# Patient Record
Sex: Female | Born: 1950 | Hispanic: No | Marital: Married | Smoking: Never smoker
Health system: Southern US, Community
[De-identification: ages and names within clinical notes are randomized; demographics above are authoritative.]

## PROBLEM LIST (undated history)

## (undated) DIAGNOSIS — B029 Zoster without complications: Secondary | ICD-10-CM

## (undated) DIAGNOSIS — M549 Dorsalgia, unspecified: Secondary | ICD-10-CM

## (undated) DIAGNOSIS — J189 Pneumonia, unspecified organism: Secondary | ICD-10-CM

## (undated) DIAGNOSIS — M8580 Other specified disorders of bone density and structure, unspecified site: Secondary | ICD-10-CM

## (undated) DIAGNOSIS — G43909 Migraine, unspecified, not intractable, without status migrainosus: Secondary | ICD-10-CM

## (undated) DIAGNOSIS — E049 Nontoxic goiter, unspecified: Secondary | ICD-10-CM

## (undated) HISTORY — DX: Nontoxic goiter, unspecified: E04.9

## (undated) HISTORY — DX: Pneumonia, unspecified organism: J18.9

## (undated) HISTORY — DX: Migraine, unspecified, not intractable, without status migrainosus: G43.909

## (undated) HISTORY — DX: Zoster without complications: B02.9

## (undated) HISTORY — DX: Other specified disorders of bone density and structure, unspecified site: M85.80

## (undated) HISTORY — DX: Dorsalgia, unspecified: M54.9

---
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
## 1991-07-25 HISTORY — PX: TUBAL LIGATION: SHX77

## 1991-07-25 HISTORY — PX: DILATION AND CURETTAGE OF UTERUS: SHX78

## 2000-07-24 HISTORY — PX: COLONOSCOPY W/ POLYPECTOMY: SHX1380

## 2010-07-24 DIAGNOSIS — E049 Nontoxic goiter, unspecified: Secondary | ICD-10-CM

## 2010-07-24 HISTORY — DX: Nontoxic goiter, unspecified: E04.9

## 2010-12-14 ENCOUNTER — Other Ambulatory Visit: Payer: Self-pay | Admitting: Internal Medicine

## 2010-12-14 DIAGNOSIS — E041 Nontoxic single thyroid nodule: Secondary | ICD-10-CM

## 2010-12-20 ENCOUNTER — Ambulatory Visit
Admission: RE | Admit: 2010-12-20 | Discharge: 2010-12-20 | Disposition: A | Discharge status: Home / Self Care | Payer: PRIVATE HEALTH INSURANCE | Source: Ambulatory Visit | Attending: Internal Medicine | Admitting: Internal Medicine

## 2010-12-20 DIAGNOSIS — E041 Nontoxic single thyroid nodule: Secondary | ICD-10-CM

## 2010-12-23 ENCOUNTER — Other Ambulatory Visit: Payer: Self-pay | Admitting: Internal Medicine

## 2010-12-23 DIAGNOSIS — Z1231 Encounter for screening mammogram for malignant neoplasm of breast: Secondary | ICD-10-CM

## 2010-12-26 ENCOUNTER — Encounter: Payer: Self-pay | Admitting: Internal Medicine

## 2010-12-27 ENCOUNTER — Ambulatory Visit (INDEPENDENT_AMBULATORY_CARE_PROVIDER_SITE_OTHER): Payer: PRIVATE HEALTH INSURANCE | Admitting: Internal Medicine

## 2010-12-27 ENCOUNTER — Encounter: Payer: Self-pay | Admitting: Internal Medicine

## 2010-12-27 VITALS — BP 110/68 | HR 78 | Temp 98.4°F | Ht 68.0 in | Wt 162.2 lb

## 2010-12-27 DIAGNOSIS — R059 Cough, unspecified: Secondary | ICD-10-CM

## 2010-12-27 DIAGNOSIS — R05 Cough: Secondary | ICD-10-CM

## 2010-12-27 DIAGNOSIS — R053 Chronic cough: Secondary | ICD-10-CM

## 2010-12-27 NOTE — Progress Notes (Signed)
Subjective:    Patient ID: Taylor Anderson, female    DOB: August 17, 1950, 60 y.o.   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  Moved from [place], [place] to [place] in August 2011. PMD is Dr. Selena Batten now and he has referred her here for pulm opinion. No major health issues. Very functional female) problem. The current episode started more than 1 year ago (Chronic cough x 10 years but was only followed by GP. Never had any workup beyond cxr in ohi0. Was tolerable so neverr took specialist help). The problem has been unchanged (Rates it as moderate intensity 75% of time.  Moving to [place] has not made it worse). Episode frequency: Brought on only by triggers but occassionally by swawllowing. The cough is non-productive. Associated symptoms include postnasal drip and rhinorrhea. Pertinent negatives include no ear pain, eye redness, fever, headaches, rash, sore throat, shortness of breath, sweats, weight loss or wheezing. Associated symptoms comments: Post nasal drip present for years. Also snores. Denies wheezing except 1-2 times only in past 8-10 years. Associated tickle in throat +. Cough can be brought on by different postures, granola bar, croutons. The symptoms are aggravated by fumes, dust, cold air, pollens and other (certain perfumes, spices, seasonings in food, dust, stale cig smoke, dusting furniture, pollen, eating something sharp like croutons produces a super sensitive cough reflex that irritates thrroat). Risk factors: Had a dog but none for 3 years but this never triggered cough. She has tried nothing Landscape architect helps. Claritin D 5 years ago for few years did not help. Halls Vit C lozenges helps - she feels this dilutes her throat and it helps) for the symptoms. The treatment provided no relief. There is no history of asthma, bronchiectasis, bronchitis, COPD, emphysema, environmental allergies or pneumonia.  Reports your dad and paternal grandmother who lived in [place], [place]  coughed due to "dirty air" in [place].    Past Medical History  Diagnosis Date  . Osteoarthritis   . Pneumonia   . Pleurisy   . Back pain      Family History  Problem Relation Age of Onset  . Asthma Maternal Aunt   . Heart disease Maternal Grandfather   . Cancer Paternal Grandfather      History   Social History  . Marital Status: Married    Spouse Name: N/A    Number of Children: N/A  . Years of Education: N/A   Occupational History  . Former Runner, broadcasting/film/video    Social History Main Topics  . Smoking status: Never Smoker   . Smokeless tobacco: Not on file  . Alcohol Use: No  . Drug Use: No  . Sexually Active: Not on file   Other Topics Concern  . Not on file   Social History Narrative  . No narrative on file     Allergies  Allergen Reactions  . Sulfa Antibiotics      No outpatient prescriptions prior to visit.     Review of Systems  Constitutional: Negative for fever, weight loss and unexpected weight change.  HENT: Positive for rhinorrhea and postnasal drip. Negative for ear pain, nosebleeds, congestion, sore throat, sneezing, trouble swallowing, dental problem and sinus pressure.   Eyes: Negative for redness and itching.  Respiratory: Positive for cough. Negative for chest tightness, shortness of breath and wheezing.   Cardiovascular: Negative for palpitations and leg swelling.  Gastrointestinal: Negative for nausea and vomiting.  Genitourinary: Negative for dysuria.  Musculoskeletal: Negative for  joint swelling.  Skin: Negative for rash.  Neurological: Negative for headaches.  Hematological: Negative for environmental allergies. Does not bruise/bleed easily.  Psychiatric/Behavioral: Negative for dysphoric mood. The patient is not nervous/anxious.        Objective:   Physical Exam  [vitalsreviewed. Constitutional: She is oriented to person, place, and time. She appears well-developed and well-nourished. No distress.  HENT:  Head: Normocephalic and  atraumatic.  Right Ear: External ear normal.  Left Ear: External ear normal.  Mouth/Throat: Oropharynx is clear and moist. No oropharyngeal exudate.  Eyes: Conjunctivae and EOM are normal. Pupils are equal, round, and reactive to light. Right eye exhibits no discharge. Left eye exhibits no discharge. No scleral icterus.  Neck: Normal range of motion. Neck supple. No JVD present. No tracheal deviation present. No thyromegaly present.       Mild sinus congestion Swollen turbinates +  Cardiovascular: Normal rate, regular rhythm, normal heart sounds and intact distal pulses.  Exam reveals no gallop and no friction rub.   No murmur heard. Pulmonary/Chest: Effort normal and breath sounds normal. No respiratory distress. She has no wheezes. She has no rales. She exhibits no tenderness.  Abdominal: Soft. Bowel sounds are normal. She exhibits no distension and no mass. There is no tenderness. There is no rebound and no guarding.  Musculoskeletal: Normal range of motion. She exhibits no edema and no tenderness.  Lymphadenopathy:    She has no cervical adenopathy.  Neurological: She is alert and oriented to person, place, and time. She has normal reflexes. No cranial nerve deficit. She exhibits normal muscle tone. Coordination normal.  Skin: Skin is warm and dry. No rash noted. She is not diaphoretic. No erythema. No pallor.  Psychiatric: She has a normal mood and affect. Her behavior is normal. Judgment and thought content normal.          Assessment & Plan:

## 2010-12-27 NOTE — Patient Instructions (Signed)
Your cough has elements of cough due to post nasal drip, asthma, and cyclical cough Please use netti pot daily  With warm salt water - nurse will show you picture Please have methacholine challenge test for asthma - call once test is done so I can review REturn in 5-6 weeks  

## 2010-12-30 ENCOUNTER — Other Ambulatory Visit: Payer: Self-pay | Admitting: Internal Medicine

## 2010-12-30 ENCOUNTER — Ambulatory Visit
Admission: RE | Admit: 2010-12-30 | Discharge: 2010-12-30 | Disposition: A | Discharge status: Home / Self Care | Payer: PRIVATE HEALTH INSURANCE | Source: Ambulatory Visit | Attending: Internal Medicine | Admitting: Internal Medicine

## 2010-12-30 DIAGNOSIS — Z1231 Encounter for screening mammogram for malignant neoplasm of breast: Secondary | ICD-10-CM

## 2010-12-30 DIAGNOSIS — E042 Nontoxic multinodular goiter: Secondary | ICD-10-CM

## 2011-01-02 ENCOUNTER — Telehealth: Payer: Self-pay | Admitting: Internal Medicine

## 2011-01-02 ENCOUNTER — Ambulatory Visit (HOSPITAL_COMMUNITY)
Admission: RE | Admit: 2011-01-02 | Discharge: 2011-01-02 | Disposition: A | Discharge status: Home / Self Care | Payer: PRIVATE HEALTH INSURANCE | Source: Ambulatory Visit | Attending: Internal Medicine | Admitting: Internal Medicine

## 2011-01-02 DIAGNOSIS — R05 Cough: Secondary | ICD-10-CM | POA: Insufficient documentation

## 2011-01-02 DIAGNOSIS — R059 Cough, unspecified: Secondary | ICD-10-CM | POA: Insufficient documentation

## 2011-01-02 NOTE — Telephone Encounter (Signed)
FYI for MR that MCT has been done. Will forward to MR per his request for call once test was completed.

## 2011-01-02 NOTE — Telephone Encounter (Signed)
Please have them fax it and leave on my desk for review

## 2011-01-03 ENCOUNTER — Other Ambulatory Visit: Payer: Self-pay | Admitting: Interventional Radiology

## 2011-01-03 ENCOUNTER — Other Ambulatory Visit (HOSPITAL_COMMUNITY)
Admission: RE | Admit: 2011-01-03 | Discharge: 2011-01-03 | Disposition: A | Discharge status: Home / Self Care | Payer: No Typology Code available for payment source | Source: Ambulatory Visit | Attending: Interventional Radiology | Admitting: Interventional Radiology

## 2011-01-03 ENCOUNTER — Ambulatory Visit
Admission: RE | Admit: 2011-01-03 | Discharge: 2011-01-03 | Disposition: A | Discharge status: Home / Self Care | Payer: PRIVATE HEALTH INSURANCE | Source: Ambulatory Visit | Attending: Internal Medicine | Admitting: Internal Medicine

## 2011-01-03 DIAGNOSIS — R053 Chronic cough: Secondary | ICD-10-CM | POA: Insufficient documentation

## 2011-01-03 DIAGNOSIS — E042 Nontoxic multinodular goiter: Secondary | ICD-10-CM

## 2011-01-03 DIAGNOSIS — E049 Nontoxic goiter, unspecified: Secondary | ICD-10-CM | POA: Insufficient documentation

## 2011-01-03 DIAGNOSIS — R05 Cough: Secondary | ICD-10-CM | POA: Insufficient documentation

## 2011-01-03 NOTE — Telephone Encounter (Signed)
Reviewed methacholine challenge  A) possible irritation of vvocal cords called vocal cord dysfynction B) associated asthma present.   Have her come in 1-2 weeks to discuss (not 5-6 weeks as originally discussed)

## 2011-01-03 NOTE — Assessment & Plan Note (Signed)
Your cough has elements of cough due to post nasal drip, asthma, and cyclical cough Please use netti pot daily  With warm salt water - nurse will show you picture Please have methacholine challenge test for asthma - call once test is done so I can review REturn in 5-6 weeks

## 2011-01-03 NOTE — Telephone Encounter (Signed)
MCT faxed and placed on MR look-at. Carron Curie, CMA

## 2011-01-04 NOTE — Telephone Encounter (Signed)
LMTCB

## 2011-01-05 NOTE — Telephone Encounter (Signed)
MR, please advise if ok to overbook.  You're schedule is book out until end of July.

## 2011-01-06 NOTE — Telephone Encounter (Signed)
[id number] to schedule pt an appt with MR

## 2011-01-06 NOTE — Telephone Encounter (Signed)
Ok to [employer], plesae let her know it will be a brief quick visit

## 2011-01-06 NOTE — Telephone Encounter (Signed)
PATIENT SCHEDULED MON 01/09/11 AT 4:30 PM

## 2011-01-09 ENCOUNTER — Ambulatory Visit (INDEPENDENT_AMBULATORY_CARE_PROVIDER_SITE_OTHER): Payer: PRIVATE HEALTH INSURANCE | Admitting: Internal Medicine

## 2011-01-09 ENCOUNTER — Encounter: Payer: Self-pay | Admitting: Internal Medicine

## 2011-01-09 VITALS — BP 108/74 | HR 81 | Temp 98.7°F | Ht 68.0 in | Wt 161.0 lb

## 2011-01-09 DIAGNOSIS — R053 Chronic cough: Secondary | ICD-10-CM

## 2011-01-09 DIAGNOSIS — R059 Cough, unspecified: Secondary | ICD-10-CM

## 2011-01-09 DIAGNOSIS — R05 Cough: Secondary | ICD-10-CM

## 2011-01-09 DIAGNOSIS — J383 Other diseases of vocal cords: Secondary | ICD-10-CM

## 2011-01-09 MED ORDER — GABAPENTIN 300 MG PO CAPS: ORAL_CAPSULE | ORAL | Status: DC

## 2011-01-09 NOTE — Progress Notes (Signed)
Subjective:    Patient ID: Taylor Anderson, female    DOB: 1950/10/15, 60 y.o.   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  Moved from [place], [place] to [place] in August 2011. PMD is Dr. Selena Batten now and he has referred her here for pulm opinion. No major health issues. Very functional female) problem. The current episode started more than 1 year ago (Chronic cough x 10 years but was only followed by GP. Never had any workup beyond cxr in ohi0. Was tolerable so neverr took specialist help). The problem has been unchanged (Rates it as moderate intensity 75% of time.  Moving to [place] has not made it worse). Episode frequency: Brought on only by triggers but occassionally by swawllowing. The cough is non-productive. Associated symptoms include postnasal drip and rhinorrhea. Pertinent negatives include no ear pain, eye redness, fever, headaches, rash, sore throat, shortness of breath, sweats, weight loss or wheezing. Associated symptoms comments: Post nasal drip present for years. Also snores. Denies wheezing except 1-2 times only in past 8-10 years. Associated tickle in throat +. Cough can be brought on by different postures, granola bar, croutons. The symptoms are aggravated by fumes, dust, cold air, pollens and other (certain perfumes, spices, seasonings in food, dust, stale cig smoke, dusting furniture, pollen, eating something sharp like croutons produces a super sensitive cough reflex that irritates thrroat). Risk factors: Had a dog but none for 3 years but this never triggered cough. She has tried nothing Landscape architect helps. Claritin D 5 years ago for few years did not help. Halls Vit C lozenges helps - she feels this dilutes her throat and it helps) for the symptoms. The treatment provided no relief. There is no history of asthma, bronchiectasis, bronchitis, COPD, emphysema, environmental allergies or pneumonia.  Reports your dad and paternal grandmother who lived in  [place], [place] coughed due to "dirty air" in [place].  OV 01/09/2011: Followup after methacholine challenge test. Flow volume loops show variable inspiratory loop c/w vocal cord dysfn. Also, strongly positive for asthma. No interim complaints. Reviewed cough hx and confirmed above. She has started sinus and gerd control measures   Review of Systems  Constitutional: Negative for fever and unexpected weight change.  HENT: Negative for ear pain, nosebleeds, congestion, sore throat, rhinorrhea, sneezing, trouble swallowing, dental problem, postnasal drip and sinus pressure.   Eyes: Negative for redness and itching.  Respiratory: Positive for cough. Negative for chest tightness, shortness of breath and wheezing.   Cardiovascular: Negative for palpitations and leg swelling.  Gastrointestinal: Negative for nausea and vomiting.  Genitourinary: Negative for dysuria.  Musculoskeletal: Negative for joint swelling.  Skin: Negative for rash.  Neurological: Negative for headaches.  Hematological: Does not bruise/bleed easily.  Psychiatric/Behavioral: Negative for dysphoric mood. The patient is not nervous/anxious.        Objective:   Physical Exam    Physical Exam  [vitalsreviewed. Constitutional: She is oriented to person, place, and time. She appears well-developed and well-nourished. No distress.  HENT:  Head: Normocephalic and atraumatic.  Right Ear: External ear normal.  Left Ear: External ear normal.  Mouth/Throat: Oropharynx is clear and moist. No oropharyngeal exudate.  Eyes: Conjunctivae and EOM are normal. Pupils are equal, round, and reactive to light. Right eye exhibits no discharge. Left eye exhibits no discharge. No scleral icterus.  Neck: Normal range of motion. Neck supple. No JVD present. No tracheal deviation present. No thyromegaly present.  Mild sinus congestion Swollen turbinates +  Cardiovascular: Normal rate, regular rhythm, normal heart sounds and intact distal  pulses.  Exam reveals no gallop and no friction rub.   No murmur heard. Pulmonary/Chest: Effort normal and breath sounds normal. No respiratory distress. She has no wheezes. She has no rales. She exhibits no tenderness.  Abdominal: Soft. Bowel sounds are normal. She exhibits no distension and no mass. There is no tenderness. There is no rebound and no guarding.  Musculoskeletal: Normal range of motion. She exhibits no edema and no tenderness.  Lymphadenopathy:    She has no cervical adenopathy.  Neurological: She is alert and oriented to person, place, and time. She has normal reflexes. No cranial nerve deficit. She exhibits normal muscle tone. Coordination normal.  Skin: Skin is warm and dry. No rash noted. She is not diaphoretic. No erythema. No pallor.  Psychiatric: She has a normal mood and affect. Her behavior is normal. Judgment and thought content normal.      Assessment & Plan:

## 2011-01-09 NOTE — Patient Instructions (Signed)
Cough is from sinus drainage, asthma, vocal cord dysfunction, possible acid reflux and all conspiring to cause cyclical cough/LPR cough #Sinus drainage  - continue netti pot daily #Possible Acid Reflux  - take otc zegerid 20 1 capsule daily on empty stomach   - take diet sheet from [place] - avoid colas, spices, cheeses, spirits, red meats, beer, chocolates, fried foods etc.,   - sleep with head end of bed elevated  - eat small frequent meals  - do not go to bed for 3 hours after last meal #Asthma   - start QVAR 2 puff twice daily - take samples and show inhaler technique #Vocal Cord Dysfunction  - will refer you to speech therapy with Mr Verdie Mosher #Cyyclical cough  - I can respect your desire not to take narcotic cough suppressant  - please choose 3 days and observe complete voice rest - no talking or whispering  - At any time there (not just the 3 days of voice rest but anytime) there is urge to cough, drink water or swallow or sip on throat lozenge  - start neurontin 300mg  po daily  X 3 days, then 300mg  po twice daily x 3 days, then 300mg  po three times daily to continue. If this makes you sleepy, cut down on dose but you should continue neurontin at all times #Followup - I will see you in 5-6 weeks.  - Move your July 24th appointment to approximately august 10-15th - good luck - any problems call or come sooner

## 2011-01-10 ENCOUNTER — Telehealth: Payer: Self-pay | Admitting: Internal Medicine

## 2011-01-10 NOTE — Telephone Encounter (Signed)
I advised the pt that she does not have to wait 3 days to see therapists that she needs to choose 3 days in a row to be silent. I advised she can choose whatever 3 days she wants that works best for her schedule but that it has to be 3 days in a row. Pt states understanding. Carron Curie, CMA

## 2011-01-11 ENCOUNTER — Telehealth: Payer: Self-pay | Admitting: Internal Medicine

## 2011-01-11 NOTE — Telephone Encounter (Signed)
NOT NECESSARY

## 2011-01-12 ENCOUNTER — Encounter: Payer: Self-pay | Admitting: Internal Medicine

## 2011-01-12 ENCOUNTER — Telehealth: Payer: Self-pay | Admitting: Internal Medicine

## 2011-01-12 NOTE — Telephone Encounter (Signed)
I spoke with the pt husband and he states the pt took one tablet of gabapentin 300mg  at 1:30pm today and she began to have chest tightness, soreness in her arms and legs, and increased sleepiness. She denies swelling, rash, or SOB. MR spoke to the husband as well. He advised pt to not take anymore Gabapentin. He advise husband to monitor the pt throughout the night and if symptoms get worse to call on call doctor or go to ED. I have added Gabapentin to allergy list. Carron Curie, CMA

## 2011-01-15 ENCOUNTER — Encounter: Payer: Self-pay | Admitting: Internal Medicine

## 2011-01-15 NOTE — Assessment & Plan Note (Signed)
Cough is from sinus drainage, asthma, vocal cord dysfunction, possible acid reflux and all conspiring to cause cyclical cough/LPR cough #Sinus drainage  - continue netti pot daily #Possible Acid Reflux  - take otc zegerid 20 1 capsule daily on empty stomach   - take diet sheet from [place] - avoid colas, spices, cheeses, spirits, red meats, beer, chocolates, fried foods etc.,   - sleep with head end of bed elevated  - eat small frequent meals  - do not go to bed for 3 hours after last meal #Asthma   - start QVAR 2 puff twice daily - take samples and show inhaler technique #Vocal Cord Dysfunction  - will refer you to speech therapy with Mr Verdie Mosher #Cyyclical cough  - I can respect your desire not to take narcotic cough suppressant  - please choose 3 days and observe complete voice rest - no talking or whispering  - At any time there (not just the 3 days of voice rest but anytime) there is urge to cough, drink water or swallow or sip on throat lozenge  - start neurontin 300mg  po daily  X 3 days, then 300mg  po twice daily x 3 days, then 300mg  po three times daily to continue. If this makes you sleepy, cut down on dose but you should continue neurontin at all times #Followup - I will see you in 5-6 weeks.  - Move your July 24th appointment to approximately august 10-15th - 100% compliance with instructions emphasized - any problems call or come sooner

## 2011-01-18 ENCOUNTER — Ambulatory Visit: Payer: PRIVATE HEALTH INSURANCE | Attending: Internal Medicine

## 2011-01-18 DIAGNOSIS — R498 Other voice and resonance disorders: Secondary | ICD-10-CM | POA: Insufficient documentation

## 2011-01-18 DIAGNOSIS — IMO0001 Reserved for inherently not codable concepts without codable children: Secondary | ICD-10-CM | POA: Insufficient documentation

## 2011-02-03 ENCOUNTER — Ambulatory Visit: Payer: PRIVATE HEALTH INSURANCE | Attending: Internal Medicine

## 2011-02-03 DIAGNOSIS — IMO0001 Reserved for inherently not codable concepts without codable children: Secondary | ICD-10-CM | POA: Insufficient documentation

## 2011-02-03 DIAGNOSIS — R498 Other voice and resonance disorders: Secondary | ICD-10-CM | POA: Insufficient documentation

## 2011-02-14 ENCOUNTER — Ambulatory Visit: Payer: PRIVATE HEALTH INSURANCE | Admitting: Internal Medicine

## 2011-02-22 ENCOUNTER — Ambulatory Visit (INDEPENDENT_AMBULATORY_CARE_PROVIDER_SITE_OTHER): Payer: PRIVATE HEALTH INSURANCE | Admitting: Internal Medicine

## 2011-02-22 ENCOUNTER — Encounter: Payer: Self-pay | Admitting: Internal Medicine

## 2011-02-22 VITALS — BP 108/72 | HR 73 | Temp 98.1°F | Ht 68.5 in | Wt 159.4 lb

## 2011-02-22 DIAGNOSIS — R053 Chronic cough: Secondary | ICD-10-CM

## 2011-02-22 DIAGNOSIS — R05 Cough: Secondary | ICD-10-CM

## 2011-02-22 DIAGNOSIS — R059 Cough, unspecified: Secondary | ICD-10-CM

## 2011-02-22 NOTE — Progress Notes (Signed)
Subjective:    Patient ID: Taylor Anderson, female    DOB: 1951/06/12, 60 y.o.   MRN: [id number]  HPI Followup multifactorial cough (sinus drainage, silent GERD, cough varian asthma - positive methacholine June 2012, vcd - seen on methacholine challenge, cyclical cough).  OV 02/22/2011: Last seen June 18. 2012. Multifactorial approach for above adopted. Neurontin made her very drowsy - even 1 dose so she stopped but followed all other advice diligently. She keeps accurate notes. Overall cough is 70% better. Rates current cough as mild and acceptable. Voice rest and speech therapy with Verdie Mosher really helped. GERD diet hhas helped too though she feels it has helped her sleep cycle better. Water also  Helps with cough.  Still has residual cough brought on by dust, perfumes, talking a lot on phone. Doing QVAR only 1 puff bid. Taking zegerid too. Does not want to escalate meds. Wants to come off meds over time. No associated dyspnea, syncope, seizures etc.,   Past Medical History  Diagnosis Date  . Osteoarthritis   . Pneumonia   . Pleurisy   . Back pain      Family History  Problem Relation Age of Onset  . Asthma Maternal Aunt   . Heart disease Maternal Grandfather   . Cancer Paternal Grandfather      History   Social History  . Marital Status: Married    Spouse Name: N/A    Number of Children: N/A  . Years of Education: N/A   Occupational History  . Former Runner, broadcasting/film/video    Social History Main Topics  . Smoking status: Never Smoker   . Smokeless tobacco: Not on file  . Alcohol Use: No  . Drug Use: No  . Sexually Active: Not on file   Other Topics Concern  . Not on file   Social History Narrative  . No narrative on file     Allergies  Allergen Reactions  . Gabapentin     Chest tightness, sleepiness  . Sulfa Antibiotics      Outpatient Prescriptions Prior to Visit  Medication Sig Dispense Refill  . cholecalciferol (VITAMIN D) 1000 UNITS tablet Take 1,000 Units by mouth  daily.        Marland Kitchen gabapentin (NEURONTIN) 300 MG capsule Take 1 tablet daily x 3 days, then 1 tab twice daily x 3 days, then 1 tab three times a day and continue.  100 capsule  2      Review of Systems  Constitutional: Negative for fever and unexpected weight change.  HENT: Negative for ear pain, nosebleeds, congestion, sore throat, rhinorrhea, sneezing, trouble swallowing, dental problem, postnasal drip and sinus pressure.   Eyes: Negative for redness and itching.  Respiratory: Negative for cough, chest tightness, shortness of breath and wheezing.   Cardiovascular: Negative for palpitations and leg swelling.  Gastrointestinal: Negative for nausea and vomiting.  Genitourinary: Negative for dysuria.  Musculoskeletal: Negative for joint swelling.  Skin: Negative for rash.  Neurological: Negative for headaches.  Hematological: Does not bruise/bleed easily.  Psychiatric/Behavioral: Negative for dysphoric mood. The patient is not nervous/anxious.        Objective:   Physical Exam  Vitals reviewed. Constitutional: She is oriented to person, place, and time. She appears well-developed and well-nourished. No distress.  HENT:  Head: Normocephalic and atraumatic.  Right Ear: External ear normal.  Left Ear: External ear normal.  Mouth/Throat: Oropharynx is clear and moist. No oropharyngeal exudate.  Eyes: Conjunctivae and EOM are normal. Pupils are equal, round, and  reactive to light. Right eye exhibits no discharge. Left eye exhibits no discharge. No scleral icterus.  Neck: Normal range of motion. Neck supple. No JVD present. No tracheal deviation present. No thyromegaly present.  Cardiovascular: Normal rate, regular rhythm, normal heart sounds and intact distal pulses.  Exam reveals no gallop and no friction rub.   No murmur heard. Pulmonary/Chest: Effort normal and breath sounds normal. No respiratory distress. She has no wheezes. She has no rales. She exhibits no tenderness.  Abdominal:  Soft. Bowel sounds are normal. She exhibits no distension and no mass. There is no tenderness. There is no rebound and no guarding.  Musculoskeletal: Normal range of motion. She exhibits no edema and no tenderness.  Lymphadenopathy:    She has no cervical adenopathy.  Neurological: She is alert and oriented to person, place, and time. She has normal reflexes. No cranial nerve deficit. She exhibits normal muscle tone. Coordination normal.  Skin: Skin is warm and dry. No rash noted. She is not diaphoretic. No erythema. No pallor.  Psychiatric: She has a normal mood and affect. Her behavior is normal. Judgment and thought content normal.          Assessment & Plan:

## 2011-02-22 NOTE — Patient Instructions (Addendum)
Cough is from sinus drainage, asthma, vocal cord dysfunction, possible acid reflux and all conspiring to cause cyclical cough/LPR cough #Sinus drainage  - continue netti pot daily #Possible Acid Reflux  - take otc zegerid 20 1 capsule daily on empty stomach: you can stop this in 1-2 months and give a trial   - avoid colas, spices, cheeses, spirits, caffeine,  beer, chocolates, fried foods etc., - use diet sheet  - sleep with head end of bed elevated  - eat small frequent meals  - do not go to bed for 3 hours after last meal #Asthma   - continue QVAR 1 puff twice daily - ok to take reduced dose. Do not stop this until we see you again #Vocal Cord Dysfunction  - follow advice of Mr Verdie Mosher #Cyyclical cough  - I  Am aware you did not tolerate neurontin  - give voice rest  - talk on phone less #Followup - I will see you in 6 months - any problems call or come sooner

## 2011-02-22 NOTE — Assessment & Plan Note (Signed)
Cough is from sinus drainage, asthma, vocal cord dysfunction, possible acid reflux and all conspiring to cause cyclical cough/LPR cough #Sinus drainage  - continue netti pot daily #Possible Acid Reflux  - take otc zegerid 20 1 capsule daily on empty stomach: you can stop this in 1-2 months and give a trial   - avoid colas, spices, cheeses, spirits, caffeine,  beer, chocolates, fried foods etc., - use diet sheet  - sleep with head end of bed elevated  - eat small frequent meals  - do not go to bed for 3 hours after last meal #Asthma   - continue QVAR 80mcg 1 puff twice daily - ok to take reduced dose. Do not stop this until we see you again #Vocal Cord Dysfunction  - follow advice of Mr Carl Schinke #Cyyclical cough  - I  Am aware you did not tolerate neurontin  - give voice rest  - talk on phone less #Followup - I will see you in 6 months - any problems call or come sooner 

## 2011-08-24 ENCOUNTER — Ambulatory Visit (INDEPENDENT_AMBULATORY_CARE_PROVIDER_SITE_OTHER): Payer: PRIVATE HEALTH INSURANCE | Admitting: Internal Medicine

## 2011-08-24 ENCOUNTER — Encounter: Payer: Self-pay | Admitting: Internal Medicine

## 2011-08-24 VITALS — BP 108/82 | HR 80 | Temp 98.0°F | Ht 68.5 in | Wt 158.6 lb

## 2011-08-24 DIAGNOSIS — R059 Cough, unspecified: Secondary | ICD-10-CM

## 2011-08-24 DIAGNOSIS — R05 Cough: Secondary | ICD-10-CM

## 2011-08-24 DIAGNOSIS — R053 Chronic cough: Secondary | ICD-10-CM

## 2011-08-24 NOTE — Progress Notes (Signed)
Subjective:    Patient ID: Taylor Anderson, female    DOB: October 18, 1950, 61 y.o.   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  Cough This is a new (61 year old female, school Runner, broadcasting/film/video. Never Smoker.  Moved from [place], [place] to [place] in August 2011. PMD is Dr. Selena Batten now and he has referred her here for pulm opinion. No major health issues. Very functional female) problem. The current episode started more than 1 year ago (Chronic cough x 10 years but was only followed by GP. Never had any workup beyond cxr in ohi0. Was tolerable so neverr took specialist help). The problem has been unchanged (Rates it as moderate intensity 75% of time.  Moving to [place] has not made it worse). Episode frequency: Brought on only by triggers but occassionally by swawllowing. The cough is non-productive. Associated symptoms include postnasal drip and rhinorrhea. Pertinent negatives include no ear pain, eye redness, fever, headaches, rash, sore throat, shortness of breath, sweats, weight loss or wheezing. Associated symptoms comments: Post nasal drip present for years. Also snores. Denies wheezing except 1-2 times only in past 8-10 years. Associated tickle in throat +. Cough can be brought on by different postures, granola bar, croutons. The symptoms are aggravated by fumes, dust, cold air, pollens and other (certain perfumes, spices, seasonings in food, dust, stale cig smoke, dusting furniture, pollen, eating something sharp like croutons produces a super sensitive cough reflex that irritates thrroat). Risk factors: Had a dog but none for 3 years but this never triggered cough. She has tried nothing Landscape architect helps. Claritin D 5 years ago for few years did not help. Halls Vit C lozenges helps - she feels this dilutes her throat and it helps) for the symptoms. The treatment provided no relief. There  is no history of asthma, bronchiectasis, bronchitis, COPD, emphysema, environmental allergies or pneumonia.  Reports your dad and paternal grandmother who lived in [place], [place] coughed due to "dirty air" in [place].  OV 01/09/2011: Followup after methacholine challenge test. Flow volume loops show variable inspiratory loop c/w vocal cord dysfn. Also, strongly positive for asthma. No interim complaints. Reviewed cough hx and confirmed above. She has started sinus and gerd control measures   OV 02/22/2011: Last seen June 18. 2012. Multifactorial approach for above adopted. Neurontin made her very drowsy - even 1 dose so she stopped but followed all other advice diligently. She keeps accurate notes. Overall cough is 70% better. Rates current cough as mild and acceptable. Voice rest and speech therapy with Verdie Mosher really helped. GERD diet hhas helped too though she feels it has helped her sleep cycle better. Water also  Helps with cough.  Still has residual cough brought on by dust, perfumes, talking a lot on phone. Doing QVAR only 1 puff bid. Taking zegerid too. Does not want to escalate meds. Wants to come off meds over time. No associated dyspnea, syncope, seizures etc.,   Cough is from sinus drainage, asthma, vocal cord dysfunction, possible acid reflux and all conspiring to cause cyclical cough/LPR cough #Sinus drainage  - continue netti pot daily #Possible Acid Reflux  - take otc zegerid 20 1 capsule daily on empty stomach: you can stop this in 1-2 months and give a trial   - avoid colas, spices, cheeses, spirits, caffeine,  beer, chocolates, fried foods etc., - use diet sheet  - sleep with head  end of bed elevated  - eat small frequent meals  - do not go to bed for 3 hours after last meal #Asthma   - continue QVAR 1 puff twice daily - ok to take reduced dose. Do not stop this until we see you again #Vocal Cord Dysfunction  - follow advice of Mr Verdie Mosher #Cyyclical cough  - I   Am aware you did not tolerate neurontin  - give voice rest  - talk on phone less #Followup - I will see you in 6 months - any problems call or come sooner  OV 08/24/2011 Followup cough. Overall much improved since last visit. Ran out of zegerid in October. Was using QVAR till mid-jan 2013 and then ran out. Cough has not recurred except a mild episode a month ago. Currently RSI cough score is 3.5 . She does have some fullness after eating. Though cough largely resolved there are triggers like cinnamon, magazine smells, odors, hand lotion with scent, or perfumes. AT this point, she wants to monitor her cough without zegerid or qvar.   Past, Family, Social reviewed: she has controlled hyperlipidemial LDL 179 -> 132 by diet alone. TC/HDL ratio  3.6 ->  3.3 by diet flaxseed, and nuts and oatmeal. REfused flu shot; feels she does not need not it   Review of Systems  Constitutional: Negative for fever and unexpected weight change.  HENT: Positive for postnasal drip. Negative for ear pain, nosebleeds, congestion, sore throat, rhinorrhea, sneezing, trouble swallowing, dental problem and sinus pressure.   Eyes: Negative for redness and itching.  Respiratory: Positive for cough. Negative for chest tightness, shortness of breath and wheezing.   Cardiovascular: Negative for palpitations and leg swelling.  Gastrointestinal: Negative for nausea and vomiting.  Genitourinary: Negative for dysuria.  Musculoskeletal: Negative for joint swelling.  Skin: Negative for rash.  Neurological: Negative for headaches.  Hematological: Does not bruise/bleed easily.  Psychiatric/Behavioral: Negative for dysphoric mood. The patient is not nervous/anxious.        Objective:   Physical Exam  Vitals reviewed. Constitutional: She is oriented to person, place, and time. She appears well-developed and well-nourished. No distress.  HENT:  Head: Normocephalic and atraumatic.  Right Ear: External ear normal.  Left Ear:  External ear normal.  Mouth/Throat: Oropharynx is clear and moist. No oropharyngeal exudate.  Eyes: Conjunctivae and EOM are normal. Pupils are equal, round, and reactive to light. Right eye exhibits no discharge. Left eye exhibits no discharge. No scleral icterus.  Neck: Normal range of motion. Neck supple. No JVD present. No tracheal deviation present. No thyromegaly present.  Cardiovascular: Normal rate, regular rhythm, normal heart sounds and intact distal pulses.  Exam reveals no gallop and no friction rub.   No murmur heard. Pulmonary/Chest: Effort normal and breath sounds normal. No respiratory distress. She has no wheezes. She has no rales. She exhibits no tenderness.  Abdominal: Soft. Bowel sounds are normal. She exhibits no distension and no mass. There is no tenderness. There is no rebound and no guarding.  Musculoskeletal: Normal range of motion. She exhibits no edema and no tenderness.  Lymphadenopathy:    She has no cervical adenopathy.  Neurological: She is alert and oriented to person, place, and time. She has normal reflexes. No cranial nerve deficit. She exhibits normal muscle tone. Coordination normal.  Skin: Skin is warm and dry. No rash noted. She is not diaphoretic. No erythema. No pallor.  Psychiatric: She has a normal mood and affect. Her behavior is normal.  Judgment and thought content normal.          Assessment & Plan:

## 2011-08-24 NOTE — Patient Instructions (Addendum)
Continue symptomatic natural measures for sinus and acid reflux control Ok to monitor off qvar Return if there are problems

## 2011-08-27 ENCOUNTER — Encounter: Payer: Self-pay | Admitting: Internal Medicine

## 2011-08-27 NOTE — Assessment & Plan Note (Signed)
Cough nearly resolved. She is not interested in ICS for methacholine challenge proven positive response. She says that through diet, healthy living and following distraction techniques she is optimistic cough will be in remission. She will return to office if there are problems. I have accepted her plan

## 2012-04-30 ENCOUNTER — Other Ambulatory Visit: Payer: Self-pay | Admitting: Internal Medicine

## 2012-04-30 DIAGNOSIS — Z1231 Encounter for screening mammogram for malignant neoplasm of breast: Secondary | ICD-10-CM

## 2012-05-30 ENCOUNTER — Ambulatory Visit
Admission: RE | Admit: 2012-05-30 | Discharge: 2012-05-30 | Disposition: A | Discharge status: Home / Self Care | Payer: PRIVATE HEALTH INSURANCE | Source: Ambulatory Visit | Attending: Internal Medicine | Admitting: Internal Medicine

## 2012-05-30 DIAGNOSIS — Z1231 Encounter for screening mammogram for malignant neoplasm of breast: Secondary | ICD-10-CM

## 2012-06-03 ENCOUNTER — Other Ambulatory Visit: Payer: Self-pay | Admitting: Internal Medicine

## 2012-06-03 DIAGNOSIS — R928 Other abnormal and inconclusive findings on diagnostic imaging of breast: Secondary | ICD-10-CM

## 2012-06-10 ENCOUNTER — Ambulatory Visit
Admission: RE | Admit: 2012-06-10 | Discharge: 2012-06-10 | Disposition: A | Discharge status: Home / Self Care | Payer: PRIVATE HEALTH INSURANCE | Source: Ambulatory Visit | Attending: Internal Medicine | Admitting: Internal Medicine

## 2012-06-10 DIAGNOSIS — R928 Other abnormal and inconclusive findings on diagnostic imaging of breast: Secondary | ICD-10-CM

## 2013-01-16 ENCOUNTER — Encounter: Payer: Self-pay | Admitting: *Deleted

## 2013-01-20 ENCOUNTER — Ambulatory Visit (INDEPENDENT_AMBULATORY_CARE_PROVIDER_SITE_OTHER): Payer: PRIVATE HEALTH INSURANCE | Admitting: Certified Nurse Midwife

## 2013-01-20 ENCOUNTER — Encounter: Payer: Self-pay | Admitting: Certified Nurse Midwife

## 2013-01-20 VITALS — BP 100/62 | HR 64 | Resp 16 | Ht 67.75 in | Wt 150.0 lb

## 2013-01-20 DIAGNOSIS — Z01419 Encounter for gynecological examination (general) (routine) without abnormal findings: Secondary | ICD-10-CM

## 2013-01-20 NOTE — Progress Notes (Signed)
62 y.o. G3P3 Married Caucasian Fe here for annual exam. Menopausal no HRT.  Denies vaginal bleeding or vaginal dryness. Had mammogram 06-09-12 with recall for asymmetry, all normal per patient repeat one year. Sees PCP for aex and labs. Cholesterol slightly elevated,working on with diet and exercise. No health issues today.   Patient's last menstrual period was 11/17/2005.          Sexually active: yes  The current method of family planning is tubal ligation.    Exercising: yes  walking & weights Smoker:  no  Health Maintenance: Pap:  01-17-12 neg HPV HR neg MMG:  05-30-12, 06-10-12 left (report reviewed in Epic) negative repeat in one year Colonoscopy: 04-04-07 BMD:  01-11-11 TDaP: 01-17-12 Labs: none Self breast exam: occ   reports that she has never smoked. She does not have any smokeless tobacco history on file. She reports that she does not drink alcohol or use illicit drugs.  Past Medical History  Diagnosis Date  . Pneumonia   . Pleurisy 1986  . Back pain   . Osteopenia   . Migraines     between 1980-1983  . Enlarged thyroid 2012    2 nodules, needle biopsy neg    Past Surgical History  Procedure Laterality Date  . Tonsillectomy and adenoidectomy  1959  . Tubal ligation  1993  . Dilation and curettage of uterus  1993  . Colonoscopy w/ polypectomy  2002    Current Outpatient Prescriptions  Medication Sig Dispense Refill  . CALCIUM PO Take 600 mg by mouth. occ      . cholecalciferol (VITAMIN D) 1000 UNITS tablet Take 1,000 Units by mouth. occ      . Coenzyme Q10 (CO Q 10 PO) Take 200 mg by mouth. occ      . Red Yeast Rice Extract (RED YEAST RICE PO) Take 1,200 mg by mouth. occ       No current facility-administered medications for this visit.    Family History  Problem Relation Age of Onset  . Asthma Maternal Aunt   . Heart disease Maternal Grandfather   . Cancer Paternal Grandfather     ROS:  Pertinent items are noted in HPI.  Otherwise, a comprehensive ROS  was negative.  Exam:   BP 100/62  Pulse 64  Resp 16  Ht 5' 7.75" (1.721 m)  Wt 150 lb (68.04 kg)  BMI 22.97 kg/m2  LMP 11/17/2005 Height: 5' 7.75" (172.1 cm)  Ht Readings from Last 3 Encounters:  01/20/13 5' 7.75" (1.721 m)  08/24/11 5' 8.5" (1.74 m)  02/22/11 5' 8.5" (1.74 m)    General appearance: alert, cooperative and appears stated age Head: Normocephalic, without obvious abnormality, atraumatic Neck: no adenopathy, supple, symmetrical, trachea midline and thyroid enlarged and 2 nodules palpated( known enlargement,no size change) Lungs: clear to auscultation bilaterally Breasts: normal appearance, no masses or tenderness, No nipple retraction or dimpling, No nipple discharge or bleeding, No axillary or supraclavicular adenopathy Heart: regular rate and rhythm Abdomen: soft, non-tender; no masses,  no organomegaly Extremities: extremities normal, atraumatic, no cyanosis or edema Skin: Skin color, texture, turgor normal. No rashes or lesions Lymph nodes: Cervical, supraclavicular, and axillary nodes normal. No abnormal inguinal nodes palpated Neurologic: Grossly normal   Pelvic: External genitalia:  no lesions              Urethra:  normal appearing urethra with no masses, tenderness or lesions  Bartholin's and Skene's: normal                 Vagina: normal appearing vagina with normal color and discharge, no lesions              Cervix: normal, non tender              Pap taken: no Bimanual Exam:  Uterus:  normal size, contour, position, consistency, mobility, non-tender and anteverted              Adnexa: normal adnexa and no mass, fullness, tenderness               Rectovaginal: Confirms               Anus:  normal sphincter tone, no lesions  A:  Well Woman with normal exam  Menopausal with no HRT  Known enlarged thyroid with nodules with negative biopsy being followed by PCP now  Mammogram with diagnostic views and us(per report in epic additional views  show no mass, repeat mammogram in one year)    P:  Reviewed health and wellness pertinent to exam  Aware of need to evaluate if vaginal bleeding occurs  Continue follow up with PCP as indicated   Pap smear as per guidelines   Mammogram yearly, IFOB with PCP pap smear not taken today  counseled on breast self exam, mammography screening, feminine hygiene, adequate intake of calcium and vitamin D, diet and exercise, Kegel's exercises  return annually or prn  An After Visit Summary was printed and given to the patient.

## 2013-01-20 NOTE — Patient Instructions (Addendum)

## 2013-01-21 NOTE — Progress Notes (Signed)
Reviewed CPRomine, MD 

## 2013-04-17 ENCOUNTER — Telehealth: Payer: Self-pay | Admitting: Certified Nurse Midwife

## 2013-04-17 NOTE — Telephone Encounter (Signed)
Patient is calling to see if we received the payment from her insurance company.

## 2014-01-22 ENCOUNTER — Ambulatory Visit: Payer: PRIVATE HEALTH INSURANCE | Admitting: Certified Nurse Midwife

## 2014-02-16 ENCOUNTER — Other Ambulatory Visit: Payer: Self-pay

## 2014-02-16 DIAGNOSIS — Z1231 Encounter for screening mammogram for malignant neoplasm of breast: Secondary | ICD-10-CM

## 2014-02-20 ENCOUNTER — Ambulatory Visit
Admission: RE | Admit: 2014-02-20 | Discharge: 2014-02-20 | Disposition: A | Discharge status: Home / Self Care | Payer: PRIVATE HEALTH INSURANCE | Source: Ambulatory Visit

## 2014-02-20 ENCOUNTER — Encounter (INDEPENDENT_AMBULATORY_CARE_PROVIDER_SITE_OTHER): Payer: Self-pay

## 2014-02-20 DIAGNOSIS — Z1231 Encounter for screening mammogram for malignant neoplasm of breast: Secondary | ICD-10-CM

## 2014-03-03 ENCOUNTER — Ambulatory Visit: Payer: PRIVATE HEALTH INSURANCE | Admitting: Certified Nurse Midwife

## 2014-03-19 ENCOUNTER — Ambulatory Visit: Payer: PRIVATE HEALTH INSURANCE | Admitting: Certified Nurse Midwife

## 2014-04-07 ENCOUNTER — Ambulatory Visit (INDEPENDENT_AMBULATORY_CARE_PROVIDER_SITE_OTHER): Payer: PRIVATE HEALTH INSURANCE | Admitting: Certified Nurse Midwife

## 2014-04-07 ENCOUNTER — Encounter: Payer: Self-pay | Admitting: Certified Nurse Midwife

## 2014-04-07 VITALS — BP 112/70 | HR 70 | Resp 16 | Ht 67.5 in | Wt 158.0 lb

## 2014-04-07 DIAGNOSIS — Z01419 Encounter for gynecological examination (general) (routine) without abnormal findings: Secondary | ICD-10-CM

## 2014-04-07 DIAGNOSIS — Z124 Encounter for screening for malignant neoplasm of cervix: Secondary | ICD-10-CM

## 2014-04-07 NOTE — Progress Notes (Signed)
63 y.o. G3P3 Married Caucasian Fe here for annual exam.  Post menopausal no HRT. Denies vaginal bleeding or vaginal dryness.  Recent death of her father in law(90), emotionally doing well. Patient has been very busy with providing care of family members. Labs and aex with PCP recently. All normal except for elevation of cholesterol, working with diet regarding. No health issues today.  Patient's last menstrual period was 11/17/2005.          Sexually active: Yes.    The current method of family planning is tubal ligation.    Exercising: Yes.    walk & weights Smoker:  no  Health Maintenance: Pap: 01-17-12 neg HPV HR neg MMG:  02-20-14 density category c, birads 1:neg Colonoscopy:  04-04-07 BMD:   02-16-14 TDaP:  01-17-12 Labs: pcp Self breast exam: done occ   reports that she has never smoked. She does not have any smokeless tobacco history on file. She reports that she does not drink alcohol or use illicit drugs.  Past Medical History  Diagnosis Date  . Pneumonia   . Pleurisy 1986  . Back pain   . Osteopenia   . Migraines     between 1980-1983  . Enlarged thyroid 2012    2 nodules, needle biopsy neg    Past Surgical History  Procedure Laterality Date  . Tonsillectomy and adenoidectomy  1959  . Tubal ligation  1993  . Dilation and curettage of uterus  1993  . Colonoscopy w/ polypectomy  2002    Current Outpatient Prescriptions  Medication Sig Dispense Refill  . CALCIUM PO Take 600 mg by mouth. occ      . Cholecalciferol (VITAMIN D PO) Take 4,000 Int'l Units by mouth daily.      . Coenzyme Q10 (CO Q 10 PO) Take 300 mg by mouth daily.       . Red Yeast Rice Extract (RED YEAST RICE PO) Take 1,200 mg by mouth. occ       No current facility-administered medications for this visit.    Family History  Problem Relation Age of Onset  . Asthma Maternal Aunt   . Heart disease Maternal Grandfather   . Cancer Paternal Grandfather     colon  . Diabetes Maternal Grandmother      ROS:  Pertinent items are noted in HPI.  Otherwise, a comprehensive ROS was negative.  Exam:   BP 112/70  Pulse 70  Resp 16  Ht 5' 7.5" (1.715 m)  Wt 158 lb (71.668 kg)  BMI 24.37 kg/m2  LMP 11/17/2005 Height: 5' 7.5" (171.5 cm)  Ht Readings from Last 3 Encounters:  04/07/14 5' 7.5" (1.715 m)  01/20/13 5' 7.75" (1.721 m)  08/24/11 5' 8.5" (1.74 m)    General appearance: alert, cooperative and appears stated age Head: Normocephalic, without obvious abnormality, atraumatic Neck: no adenopathy, supple, symmetrical, trachea midline and thyroid normal to inspection and palpation Lungs: clear to auscultation bilaterally Breasts: normal appearance, no masses or tenderness, No nipple retraction or dimpling, No nipple discharge or bleeding, No axillary or supraclavicular adenopathy Heart: regular rate and rhythm Abdomen: soft, non-tender; no masses,  no organomegaly Extremities: extremities normal, atraumatic, no cyanosis or edema Skin: Skin color, texture, turgor normal. No rashes or lesions Lymph nodes: Cervical, supraclavicular, and axillary nodes normal. No abnormal inguinal nodes palpated Neurologic: Grossly normal   Pelvic: External genitalia:  no lesions              Urethra:  normal appearing urethra with  no masses, tenderness or lesions              Bartholin's and Skene's: normal                 Vagina: normal appearing vagina with normal color and discharge, no lesions              Cervix: normal, non tender, no lesions bleeding with pap only              Pap taken: Yes.   Bimanual Exam:  Uterus:  normal size, contour, position, consistency, mobility, non-tender and mid position              Adnexa: normal adnexa and no mass, fullness, tenderness               Rectovaginal: Confirms               Anus:  normal sphincter tone, no lesions  A:  Well Woman with normal exam  Menopausal no HRT  Cholesterol management with PCP   P:   Reviewed health and wellness  pertinent to exam  Aware of need to advise if vaginal bleeding  Continue follow up as indicated  Pap smear taken today with HPV reflex   counseled on breast self exam, mammography screening, adequate intake of calcium and vitamin D, diet and exercise  return annually or prn  An After Visit Summary was printed and given to the patient.

## 2014-04-07 NOTE — Patient Instructions (Signed)

## 2014-04-08 LAB — IPS PAP TEST WITH REFLEX TO HPV

## 2014-04-13 NOTE — Progress Notes (Signed)
Reviewed personally.  M. Suzanne Emmalynne Courtney, MD.  

## 2014-05-25 ENCOUNTER — Encounter: Payer: Self-pay | Admitting: Certified Nurse Midwife

## 2015-04-14 ENCOUNTER — Encounter: Payer: Self-pay | Admitting: Certified Nurse Midwife

## 2015-04-14 ENCOUNTER — Ambulatory Visit (INDEPENDENT_AMBULATORY_CARE_PROVIDER_SITE_OTHER): Payer: PRIVATE HEALTH INSURANCE | Admitting: Certified Nurse Midwife

## 2015-04-14 VITALS — BP 110/78 | HR 70 | Resp 16 | Ht 67.5 in | Wt 156.0 lb

## 2015-04-14 DIAGNOSIS — Z01419 Encounter for gynecological examination (general) (routine) without abnormal findings: Secondary | ICD-10-CM | POA: Diagnosis not present

## 2015-04-14 NOTE — Progress Notes (Signed)
Reviewed personally.  M. Suzanne Miller, MD.  

## 2015-04-14 NOTE — Progress Notes (Signed)
64 y.o. G3P3 Married  Caucasian Fe here for annual exam. Menopausal no HRT. Occasional hot flash. Denies vaginal bleeding or vaginal dryness. Sees PCP for aex/labs. Labs were better with cholesterol level decreased. Good year! No health issues today. Took trip to [place].   Patient's last menstrual period was 11/17/2005.          Sexually active: Yes.    The current method of family planning is tubal ligation.    Exercising: Yes.    walking Smoker:  no  Health Maintenance: Pap: 04-07-14 neg MMG: 02-20-14 category c density,birads 1:neg needs to schedule with 3 D has reminder card Colonoscopy:  2008 neg. 10 years BMD:   2015  normal TDaP:  2013 Labs: pcp Self breast exam: done occ   reports that she has never smoked. She does not have any smokeless tobacco history on file. She reports that she does not drink alcohol or use illicit drugs.  Past Medical History  Diagnosis Date  . Pneumonia   . Pleurisy 1986  . Back pain   . Osteopenia   . Migraines     between 1980-1983  . Enlarged thyroid 2012    2 nodules, needle biopsy neg    Past Surgical History  Procedure Laterality Date  . Tonsillectomy and adenoidectomy  1959  . Tubal ligation  1993  . Dilation and curettage of uterus  1993  . Colonoscopy w/ polypectomy  2002    Current Outpatient Prescriptions  Medication Sig Dispense Refill  . CALCIUM PO Take 600 mg by mouth. occ    . Cholecalciferol (VITAMIN D PO) Take 4,000 Int'l Units by mouth daily.    . Coenzyme Q10 (CO Q 10 PO) Take 300 mg by mouth daily.     . Multiple Vitamins-Minerals (MULTIVITAMIN PO) Take by mouth daily.    . Red Yeast Rice Extract (RED YEAST RICE PO) Take 1,200 mg by mouth. occ     No current facility-administered medications for this visit.    Family History  Problem Relation Age of Onset  . Asthma Maternal Aunt   . Heart disease Maternal Grandfather   . Cancer Paternal Grandfather     colon  . Diabetes Maternal Grandmother     ROS:   Pertinent items are noted in HPI.  Otherwise, a comprehensive ROS was negative.  Exam:   BP 110/78 mmHg  Pulse 70  Resp 16  Ht 5' 7.5" (1.715 m)  Wt 156 lb (70.761 kg)  BMI 24.06 kg/m2  LMP 11/17/2005 Height: 5' 7.5" (171.5 cm) Ht Readings from Last 3 Encounters:  04/14/15 5' 7.5" (1.715 m)  04/07/14 5' 7.5" (1.715 m)  01/20/13 5' 7.75" (1.721 m)    General appearance: alert, cooperative and appears stated age Head: Normocephalic, without obvious abnormality, atraumatic Neck: no adenopathy, supple, symmetrical, trachea midline and thyroid normal to inspection and palpation Lungs: clear to auscultation bilaterally Breasts: normal appearance, no masses or tenderness, No nipple retraction or dimpling, No nipple discharge or bleeding, No axillary or supraclavicular adenopathy Heart: regular rate and rhythm Abdomen: soft, non-tender; no masses,  no organomegaly Extremities: extremities normal, atraumatic, no cyanosis or edema Skin: Skin color, texture, turgor normal. No rashes or lesions Lymph nodes: Cervical, supraclavicular, and axillary nodes normal. No abnormal inguinal nodes palpated Neurologic: Grossly normal   Pelvic: External genitalia:  no lesions              Urethra:  normal appearing urethra with no masses, tenderness or lesions  Bartholin's and Skene's: normal                 Vagina: normal appearing vagina with normal color and discharge, no lesions              Cervix: normal,non tender,no lesions              Pap taken: No. Bimanual Exam:  Uterus:  normal size, contour, position, consistency, mobility, non-tender and anteverted              Adnexa: normal adnexa and no mass, fullness, tenderness               Rectovaginal: Confirms               Anus:  normal sphincter tone, no lesions  Chaperone present: yes  A:  Well Woman with normal exam  Menopausal no HRT  Vaginal dryness noted on exam  PCP manages cholesterol/labs  P:   Reviewed health and  wellness pertinent to exam  Suggested coconut oil 2 x weekly will not use Estrogen. Advise if problems with.   Continue follow up as indicated  Pap smear as above not taken   counseled on breast self exam, mammography screening, adequate intake of calcium and vitamin D, diet and exercise  return annually or prn  An After Visit Summary was printed and given to the patient.

## 2015-04-14 NOTE — Patient Instructions (Signed)

## 2015-04-30 ENCOUNTER — Other Ambulatory Visit: Payer: Self-pay

## 2015-04-30 DIAGNOSIS — Z1231 Encounter for screening mammogram for malignant neoplasm of breast: Secondary | ICD-10-CM

## 2015-05-25 ENCOUNTER — Ambulatory Visit
Admission: RE | Admit: 2015-05-25 | Discharge: 2015-05-25 | Disposition: A | Discharge status: Home / Self Care | Payer: PRIVATE HEALTH INSURANCE | Source: Ambulatory Visit

## 2015-05-25 DIAGNOSIS — Z1231 Encounter for screening mammogram for malignant neoplasm of breast: Secondary | ICD-10-CM

## 2016-04-14 ENCOUNTER — Ambulatory Visit: Payer: PRIVATE HEALTH INSURANCE | Admitting: Certified Nurse Midwife

## 2016-04-18 ENCOUNTER — Ambulatory Visit: Payer: PRIVATE HEALTH INSURANCE | Admitting: Certified Nurse Midwife

## 2016-07-07 ENCOUNTER — Other Ambulatory Visit: Payer: Self-pay | Admitting: Certified Nurse Midwife

## 2016-07-07 DIAGNOSIS — Z1231 Encounter for screening mammogram for malignant neoplasm of breast: Secondary | ICD-10-CM

## 2016-07-25 ENCOUNTER — Ambulatory Visit: Payer: PRIVATE HEALTH INSURANCE | Admitting: Certified Nurse Midwife

## 2016-07-27 ENCOUNTER — Ambulatory Visit: Payer: PRIVATE HEALTH INSURANCE | Admitting: Certified Nurse Midwife

## 2016-08-04 ENCOUNTER — Ambulatory Visit (INDEPENDENT_AMBULATORY_CARE_PROVIDER_SITE_OTHER): Payer: Managed Care, Other (non HMO) | Admitting: Certified Nurse Midwife

## 2016-08-04 ENCOUNTER — Encounter: Payer: Self-pay | Admitting: Certified Nurse Midwife

## 2016-08-04 VITALS — BP 106/62 | HR 80 | Resp 16 | Ht 67.5 in | Wt 150.0 lb

## 2016-08-04 DIAGNOSIS — Z01419 Encounter for gynecological examination (general) (routine) without abnormal findings: Secondary | ICD-10-CM

## 2016-08-04 DIAGNOSIS — N951 Menopausal and female climacteric states: Secondary | ICD-10-CM | POA: Diagnosis not present

## 2016-08-04 DIAGNOSIS — Z124 Encounter for screening for malignant neoplasm of cervix: Secondary | ICD-10-CM

## 2016-08-04 DIAGNOSIS — Z1211 Encounter for screening for malignant neoplasm of colon: Secondary | ICD-10-CM | POA: Diagnosis not present

## 2016-08-04 NOTE — Progress Notes (Signed)
66 y.o. G3P3 Married  Caucasian Fe here for annual exam.Menopausal no HRT. Denies vaginal bleeding or dryness. Has had weight loss with caring for father, who had open heart surgery. "He is doing great at 60". Sees PCP for aex and labs. No health issues today. Whole family spent Christmas in [place]!  Patient's last menstrual period was 11/17/2005.          Sexually active: Yes.    The current method of family planning is tubal ligation.    Exercising: No.  exercise Smoker:  no  Health Maintenance: Pap:  04-07-14 neg MMG:  05-25-15 category d density birads 1:neg, scheduled for tuesday Colonoscopy:  03/2007 neg f/u 15yrs due this year BMD:   2015 normal, scheduled for may 2018 TDaP:  2013 Shingles: no Pneumonia: no Hep C and HIV: HIV done, Hep C will do with PCP Labs: pcp Self breast exam: done occ   reports that she has never smoked. She has never used smokeless tobacco. She reports that she does not drink alcohol or use drugs.  Past Medical History:  Diagnosis Date  . Back pain   . Enlarged thyroid 2012   2 nodules, needle biopsy neg  . Migraines    between 1980-1983  . Osteopenia   . Pleurisy 1986  . Pneumonia     Past Surgical History:  Procedure Laterality Date  . COLONOSCOPY W/ POLYPECTOMY  2002  . DILATION AND CURETTAGE OF UTERUS  1993  . TONSILLECTOMY AND ADENOIDECTOMY  1959  . TUBAL LIGATION  1993    Current Outpatient Prescriptions  Medication Sig Dispense Refill  . CALCIUM PO Take 600 mg by mouth. occ    . Cholecalciferol (VITAMIN D PO) Take 4,000 Int'l Units by mouth daily.    . Coenzyme Q10 (CO Q 10 PO) Take 300 mg by mouth daily.     . Multiple Vitamins-Minerals (MULTIVITAMIN PO) Take by mouth daily.    . Red Yeast Rice Extract (RED YEAST RICE PO) Take 1,200 mg by mouth. occ    . TURMERIC CURCUMIN PO Take by mouth.     No current facility-administered medications for this visit.     Family History  Problem Relation Age of Onset  . Asthma Maternal Aunt    . Heart disease Maternal Grandfather   . Cancer Paternal Grandfather     colon  . Diabetes Maternal Grandmother   . Heart disease Father     bypass    ROS:  Pertinent items are noted in HPI.  Otherwise, a comprehensive ROS was negative.  Exam:   BP 106/62   Pulse 80   Resp 16   Ht 5' 7.5" (1.715 m)   Wt 150 lb (68 kg)   LMP 11/17/2005   BMI 23.15 kg/m  Height: 5' 7.5" (171.5 cm) Ht Readings from Last 3 Encounters:  08/04/16 5' 7.5" (1.715 m)  04/14/15 5' 7.5" (1.715 m)  04/07/14 5' 7.5" (1.715 m)    General appearance: alert, cooperative and appears stated age Head: Normocephalic, without obvious abnormality, atraumatic Neck: no adenopathy, supple, symmetrical, trachea midline and thyroid normal to inspection and palpation Lungs: clear to auscultation bilaterally Breasts: normal appearance, no masses or tenderness, No nipple retraction or dimpling, No nipple discharge or bleeding, No axillary or supraclavicular adenopathy Heart: regular rate and rhythm Abdomen: soft, non-tender; no masses,  no organomegaly Extremities: extremities normal, atraumatic, no cyanosis or edema Skin: Skin color, texture, turgor normal. No rashes or lesions Lymph nodes: Cervical, supraclavicular, and axillary  nodes normal. No abnormal inguinal nodes palpated Neurologic: Grossly normal   Pelvic: External genitalia:  no lesions              Urethra:  normal appearing urethra with no masses, tenderness or lesions              Bartholin's and Skene's: normal                 Vagina: normal appearing vagina with normal color and discharge, no lesions              Cervix: multiparous appearance, no cervical motion tenderness and no lesions              Pap taken: Yes.   Bimanual Exam:  Uterus:  normal size, contour, position, consistency, mobility, non-tender              Adnexa: normal adnexa and no mass, fullness, tenderness               Rectovaginal: Confirms               Anus:  normal  sphincter tone, no lesions  Chaperone present: yes  A:  Well Woman with normal exam  Menopausal no HRT  Mammogram and colonoscopy due  P:   Reviewed health and wellness pertinent to exam  Aware of need to advise if vaginal bleeding  Patient has mammogram scheduled and would like referral for colonoscopy. Patient will be called with appointment information.  Pap smear as above with HPV reflex   counseled on breast self exam, mammography screening, menopause, adequate intake of calcium and vitamin D, diet and exercise  return annually or prn  An After Visit Summary was printed and given to the patient.

## 2016-08-04 NOTE — Patient Instructions (Signed)

## 2016-08-05 NOTE — Progress Notes (Signed)
Encounter reviewed Jill Jertson, MD   

## 2016-08-08 ENCOUNTER — Ambulatory Visit: Payer: PRIVATE HEALTH INSURANCE

## 2016-08-15 NOTE — Addendum Note (Signed)
Addended by: Verner CholLEONARD, DEBORAH S on: 08/15/2016 01:02 PM   Modules accepted: Orders

## 2016-08-17 LAB — IPS PAP TEST WITH REFLEX TO HPV

## 2016-09-05 ENCOUNTER — Ambulatory Visit: Payer: PRIVATE HEALTH INSURANCE

## 2016-09-14 ENCOUNTER — Ambulatory Visit
Admission: RE | Admit: 2016-09-14 | Discharge: 2016-09-14 | Disposition: A | Discharge status: Home / Self Care | Payer: 59 | Source: Ambulatory Visit | Attending: Certified Nurse Midwife | Admitting: Certified Nurse Midwife

## 2016-09-14 DIAGNOSIS — Z1231 Encounter for screening mammogram for malignant neoplasm of breast: Secondary | ICD-10-CM

## 2016-12-25 DIAGNOSIS — H2513 Age-related nuclear cataract, bilateral: Secondary | ICD-10-CM | POA: Insufficient documentation

## 2016-12-25 DIAGNOSIS — H52203 Unspecified astigmatism, bilateral: Secondary | ICD-10-CM | POA: Insufficient documentation

## 2016-12-25 DIAGNOSIS — H43813 Vitreous degeneration, bilateral: Secondary | ICD-10-CM | POA: Insufficient documentation

## 2016-12-25 DIAGNOSIS — H57052 Tonic pupil, left eye: Secondary | ICD-10-CM | POA: Insufficient documentation

## 2016-12-25 DIAGNOSIS — Z83511 Family history of glaucoma: Secondary | ICD-10-CM | POA: Insufficient documentation

## 2016-12-25 DIAGNOSIS — H524 Presbyopia: Secondary | ICD-10-CM

## 2017-08-10 ENCOUNTER — Other Ambulatory Visit: Payer: Self-pay

## 2017-08-10 ENCOUNTER — Ambulatory Visit (INDEPENDENT_AMBULATORY_CARE_PROVIDER_SITE_OTHER): Payer: 59 | Admitting: Certified Nurse Midwife

## 2017-08-10 ENCOUNTER — Encounter: Payer: Self-pay | Admitting: Certified Nurse Midwife

## 2017-08-10 VITALS — BP 122/70 | HR 68 | Resp 16 | Ht 67.25 in | Wt 159.0 lb

## 2017-08-10 DIAGNOSIS — N951 Menopausal and female climacteric states: Secondary | ICD-10-CM

## 2017-08-10 DIAGNOSIS — Z01419 Encounter for gynecological examination (general) (routine) without abnormal findings: Secondary | ICD-10-CM | POA: Diagnosis not present

## 2017-08-10 DIAGNOSIS — Z8619 Personal history of other infectious and parasitic diseases: Secondary | ICD-10-CM

## 2017-08-10 NOTE — Progress Notes (Signed)
67 y.o. G3P3 Married  Caucasian Fe here for annual exam. Post menopausal no vaginal bleeding or vaginal dryness. Had shingles outbreak on right side of face in fall was treated by PCP with Valtrex. Has recovered with no issues.  Sees Dr.Kim for aex/labs yearly. All stable per patient. Colonoscopy last year negative. No health issues today.   Patient's last menstrual period was 11/17/2005.          Sexually active: Yes.    The current method of family planning is tubal ligation.    Exercising: Yes.    walking Smoker:  no  Health Maintenance: Pap:  04-07-14 neg, 08-04-16 neg History of Abnormal Pap: no MMG:  09-14-16 category c density birads 1:neg Self Breast exams: occasional Colonoscopy: 06/25/17 f/u 1939yrs BMD:   11/21/16 - Osteopenia TDaP:  2013 Shingles: none Pneumonia: 05/30/17 Pneumovax Hep C and HIV: Hep C (11/21/16) and HIV done - negative Labs: PCP   reports that  has never smoked. she has never used smokeless tobacco. She reports that she does not drink alcohol or use drugs.  Past Medical History:  Diagnosis Date  . Back pain   . Enlarged thyroid 2012   2 nodules, needle biopsy neg  . Migraines    between 1980-1983  . Osteopenia   . Pleurisy 1986  . Pneumonia     Past Surgical History:  Procedure Laterality Date  . COLONOSCOPY W/ POLYPECTOMY  2002  . DILATION AND CURETTAGE OF UTERUS  1993  . TONSILLECTOMY AND ADENOIDECTOMY  1959  . TUBAL LIGATION  1993    Current Outpatient Medications  Medication Sig Dispense Refill  . CALCIUM PO Take 600 mg by mouth 2 (two) times daily. occ     . Cholecalciferol (VITAMIN D PO) Take 2,000 Int'l Units by mouth 2 (two) times daily.     . Multiple Vitamins-Minerals (MULTIVITAMIN PO) Take by mouth daily.    . TURMERIC CURCUMIN PO Take 500 mg by mouth daily.      No current facility-administered medications for this visit.     Family History  Problem Relation Age of Onset  . Asthma Maternal Aunt   . Heart disease Maternal  Grandfather   . Cancer Paternal Grandfather        colon  . Diabetes Maternal Grandmother   . Heart disease Father        bypass  . Breast cancer Neg Hx     ROS:  Pertinent items are noted in HPI.  Otherwise, a comprehensive ROS was negative.  Exam:   BP 122/70 (BP Location: Right Arm, Patient Position: Sitting, Cuff Size: Normal)   Pulse 68   Resp 16   Ht 5' 7.25" (1.708 m)   Wt 159 lb (72.1 kg)   LMP 11/17/2005   BMI 24.72 kg/m  Height: 5' 7.25" (170.8 cm) Ht Readings from Last 3 Encounters:  08/10/17 5' 7.25" (1.708 m)  08/04/16 5' 7.5" (1.715 m)  04/14/15 5' 7.5" (1.715 m)    General appearance: alert, cooperative and appears stated age Head: Normocephalic, without obvious abnormality, atraumatic Neck: no adenopathy, supple, symmetrical, trachea midline and thyroid normal to inspection and palpation Lungs: clear to auscultation bilaterally Breasts: normal appearance, no masses or tenderness, No nipple retraction or dimpling, No nipple discharge or bleeding, No axillary or supraclavicular adenopathy Heart: regular rate and rhythm Abdomen: soft, non-tender; no masses,  no organomegaly Extremities: extremities normal, atraumatic, no cyanosis or edema Skin: Skin color, texture, turgor normal. No rashes or lesions Lymph  nodes: Cervical, supraclavicular, and axillary nodes normal. No abnormal inguinal nodes palpated Neurologic: Grossly normal   Pelvic: External genitalia:  no lesions              Urethra:  normal appearing urethra with no masses, tenderness or lesions              Bartholin's and Skene's: normal                 Vagina: normal appearing vagina with normal color and discharge, no lesions              Cervix: no cervical motion tenderness, no lesions and normal appearance              Pap taken: No. Bimanual Exam:  Uterus:  normal size, contour, position, consistency, mobility, non-tender              Adnexa: normal adnexa and no mass, fullness, tenderness                Rectovaginal: Confirms               Anus:  normal sphincter tone, no lesions  Chaperone present: yes  A:  Well Woman with normal exam  Menopausal no HRT  Shingles occurrence this year with treatment with PCP  P:   Reviewed health and wellness pertinent to exam  Aware of need to advise if vaginal bleeding  Consider Vaccine later this year  Pap smear: no  counseled on breast self exam, feminine hygiene, adequate intake of calcium and vitamin D, diet and exercise return annually or prn  An After Visit Summary was printed and given to the patient.

## 2017-08-10 NOTE — Patient Instructions (Signed)

## 2017-11-26 ENCOUNTER — Other Ambulatory Visit: Payer: Self-pay | Admitting: Obstetrics & Gynecology

## 2017-11-26 DIAGNOSIS — Z1231 Encounter for screening mammogram for malignant neoplasm of breast: Secondary | ICD-10-CM

## 2017-12-12 ENCOUNTER — Ambulatory Visit
Admission: RE | Admit: 2017-12-12 | Discharge: 2017-12-12 | Disposition: A | Discharge status: Home / Self Care | Payer: 59 | Source: Ambulatory Visit | Attending: Obstetrics & Gynecology | Admitting: Obstetrics & Gynecology

## 2017-12-12 DIAGNOSIS — Z1231 Encounter for screening mammogram for malignant neoplasm of breast: Secondary | ICD-10-CM

## 2017-12-28 DIAGNOSIS — H02831 Dermatochalasis of right upper eyelid: Secondary | ICD-10-CM | POA: Insufficient documentation

## 2017-12-28 DIAGNOSIS — H02834 Dermatochalasis of left upper eyelid: Secondary | ICD-10-CM

## 2018-06-03 DIAGNOSIS — E7800 Pure hypercholesterolemia, unspecified: Secondary | ICD-10-CM | POA: Diagnosis not present

## 2018-06-03 DIAGNOSIS — Z0000 Encounter for general adult medical examination without abnormal findings: Secondary | ICD-10-CM | POA: Diagnosis not present

## 2018-06-12 DIAGNOSIS — M1612 Unilateral primary osteoarthritis, left hip: Secondary | ICD-10-CM | POA: Diagnosis not present

## 2018-06-12 DIAGNOSIS — M79642 Pain in left hand: Secondary | ICD-10-CM | POA: Diagnosis not present

## 2018-06-12 DIAGNOSIS — M79641 Pain in right hand: Secondary | ICD-10-CM | POA: Diagnosis not present

## 2018-06-12 DIAGNOSIS — M19041 Primary osteoarthritis, right hand: Secondary | ICD-10-CM | POA: Diagnosis not present

## 2018-06-12 DIAGNOSIS — Z0000 Encounter for general adult medical examination without abnormal findings: Secondary | ICD-10-CM | POA: Diagnosis not present

## 2018-06-12 DIAGNOSIS — M25561 Pain in right knee: Secondary | ICD-10-CM | POA: Diagnosis not present

## 2018-06-12 DIAGNOSIS — M19042 Primary osteoarthritis, left hand: Secondary | ICD-10-CM | POA: Diagnosis not present

## 2018-06-12 DIAGNOSIS — M1711 Unilateral primary osteoarthritis, right knee: Secondary | ICD-10-CM | POA: Diagnosis not present

## 2018-06-12 DIAGNOSIS — M25552 Pain in left hip: Secondary | ICD-10-CM | POA: Diagnosis not present

## 2018-08-21 ENCOUNTER — Encounter: Payer: Self-pay | Admitting: Certified Nurse Midwife

## 2018-08-21 ENCOUNTER — Other Ambulatory Visit (HOSPITAL_COMMUNITY)
Admission: RE | Admit: 2018-08-21 | Discharge: 2018-08-21 | Disposition: A | Discharge status: Home / Self Care | Payer: Medicare HMO | Source: Ambulatory Visit | Attending: Certified Nurse Midwife | Admitting: Certified Nurse Midwife

## 2018-08-21 ENCOUNTER — Ambulatory Visit (INDEPENDENT_AMBULATORY_CARE_PROVIDER_SITE_OTHER): Payer: Medicare HMO | Admitting: Certified Nurse Midwife

## 2018-08-21 ENCOUNTER — Other Ambulatory Visit: Payer: Self-pay

## 2018-08-21 VITALS — BP 102/62 | HR 64 | Resp 16 | Ht 67.25 in | Wt 157.0 lb

## 2018-08-21 DIAGNOSIS — N951 Menopausal and female climacteric states: Secondary | ICD-10-CM

## 2018-08-21 DIAGNOSIS — Z124 Encounter for screening for malignant neoplasm of cervix: Secondary | ICD-10-CM

## 2018-08-21 DIAGNOSIS — Z8739 Personal history of other diseases of the musculoskeletal system and connective tissue: Secondary | ICD-10-CM

## 2018-08-21 DIAGNOSIS — Z01419 Encounter for gynecological examination (general) (routine) without abnormal findings: Secondary | ICD-10-CM

## 2018-08-21 NOTE — Patient Instructions (Signed)

## 2018-08-21 NOTE — Progress Notes (Signed)
68 y.o. G3P3 Married  Caucasian Fe here for annual exam. Menopausal no HRT. Sees Dr. Selena Batten for labs and aex yearly. No issues this past year. Denies any vaginal bleeding or dryness that she aware of. Parents aging and she is overseeing care as needed. No health issues today.  Patient's last menstrual period was 11/17/2005.          Sexually active: Yes.    The current method of family planning is tubal ligation.  Exercising: Yes.    walking, strength exercises Smoker:  no  Review of Systems  Constitutional: Negative.   HENT: Negative.   Eyes: Negative.   Respiratory: Negative.   Cardiovascular: Negative.   Gastrointestinal: Negative.   Genitourinary: Negative.   Musculoskeletal: Negative.   Skin: Negative.   Neurological: Negative.   Endo/Heme/Allergies: Negative.   Psychiatric/Behavioral: Negative.     Health Maintenance: Pap:  08-04-16 neg History of Abnormal Pap: no MMG:  12-12-17 category d density birads 1:neg Self Breast exams: yes Colonoscopy:  2018 f/u 21yrs BMD:   2018 osteopenia TDaP:  2013 Shingles: not done Pneumonia: 2018 Hep C and HIV: both neg per patient Labs: if needed   reports that she has never smoked. She has never used smokeless tobacco. She reports that she does not drink alcohol or use drugs.  Past Medical History:  Diagnosis Date  . Back pain   . Enlarged thyroid 2012   2 nodules, needle biopsy neg  . Migraines    between 1980-1983  . Osteopenia   . Pleurisy 1986  . Pneumonia   . Shingles     Past Surgical History:  Procedure Laterality Date  . COLONOSCOPY W/ POLYPECTOMY  2002  . DILATION AND CURETTAGE OF UTERUS  1993  . TONSILLECTOMY AND ADENOIDECTOMY  1959  . TUBAL LIGATION  1993    Current Outpatient Medications  Medication Sig Dispense Refill  . CALCIUM PO Take 600 mg by mouth 2 (two) times daily. + d    . Cholecalciferol (VITAMIN D PO) Take 3,000 Int'l Units by mouth daily.     . Multiple Vitamins-Minerals (MULTIVITAMIN PO) Take  by mouth daily.    . TURMERIC CURCUMIN PO Take 500 mg by mouth daily.      No current facility-administered medications for this visit.     Family History  Problem Relation Age of Onset  . Asthma Maternal Aunt   . Heart disease Maternal Grandfather   . Cancer Paternal Grandfather        colon  . Diabetes Maternal Grandmother   . Heart disease Father        bypass  . Breast cancer Neg Hx     ROS:  Pertinent items are noted in HPI.  Otherwise, a comprehensive ROS was negative.  Exam:   BP 102/62   Pulse 64   Resp 16   Ht 5' 7.25" (1.708 m)   Wt 157 lb (71.2 kg)   LMP 11/17/2005   BMI 24.41 kg/m  Height: 5' 7.25" (170.8 cm) Ht Readings from Last 3 Encounters:  08/21/18 5' 7.25" (1.708 m)  08/10/17 5' 7.25" (1.708 m)  08/04/16 5' 7.5" (1.715 m)    General appearance: alert, cooperative and appears stated age Head: Normocephalic, without obvious abnormality, atraumatic Neck: no adenopathy, supple, symmetrical, trachea midline and thyroid normal to inspection and palpation Lungs: clear to auscultation bilaterally Breasts: normal appearance, no masses or tenderness, No nipple retraction or dimpling, No nipple discharge or bleeding, No axillary or supraclavicular adenopathy Heart:  regular rate and rhythm Abdomen: soft, non-tender; no masses,  no organomegaly Extremities: extremities normal, atraumatic, no cyanosis or edema Skin: Skin color, texture, turgor normal. No rashes or lesions Lymph nodes: Cervical, supraclavicular, and axillary nodes normal. No abnormal inguinal nodes palpated Neurologic: Grossly normal   Pelvic: External genitalia:  no lesions              Urethra:  normal appearing urethra with no masses, tenderness or lesions              Bartholin's and Skene's: normal                 Vagina: normal appearing vagina with normal color and discharge, no lesions              Cervix: no cervical motion tenderness, no lesions and normal appearance               Pap taken: Yes.   Bimanual Exam:  Uterus:  normal size, contour, position, consistency, mobility, non-tender and anteverted              Adnexa: normal adnexa and no mass, fullness, tenderness               Rectovaginal: Confirms               Anus:  normal sphincter tone, no lesions  Chaperone present: yes  A:  Well Woman with normal exam  Menopausal no HRT  BMD due this year, history of Osteopenia  Social stress with caring for parents  P:   Reviewed health and wellness pertinent to exam  Aware to advise if vaginal bleeding  Patient will advise if she needs to help with scheduling. PCP had scheduled prior.  Seek help from other family or friends as needed  Pap smear: yes   counseled on breast self exam, mammography screening, feminine hygiene, adequate intake of calcium and vitamin D, diet and exercise  return annually or prn  An After Visit Summary was printed and given to the patient.

## 2018-08-23 LAB — CYTOLOGY - PAP
Diagnosis: NEGATIVE
HPV: NOT DETECTED

## 2018-12-31 DIAGNOSIS — Z83511 Family history of glaucoma: Secondary | ICD-10-CM | POA: Diagnosis not present

## 2018-12-31 DIAGNOSIS — H2513 Age-related nuclear cataract, bilateral: Secondary | ICD-10-CM | POA: Diagnosis not present

## 2018-12-31 DIAGNOSIS — H524 Presbyopia: Secondary | ICD-10-CM | POA: Diagnosis not present

## 2018-12-31 DIAGNOSIS — H52202 Unspecified astigmatism, left eye: Secondary | ICD-10-CM | POA: Diagnosis not present

## 2018-12-31 DIAGNOSIS — H02834 Dermatochalasis of left upper eyelid: Secondary | ICD-10-CM | POA: Diagnosis not present

## 2018-12-31 DIAGNOSIS — H02831 Dermatochalasis of right upper eyelid: Secondary | ICD-10-CM | POA: Diagnosis not present

## 2018-12-31 DIAGNOSIS — H57052 Tonic pupil, left eye: Secondary | ICD-10-CM | POA: Diagnosis not present

## 2018-12-31 DIAGNOSIS — H43813 Vitreous degeneration, bilateral: Secondary | ICD-10-CM | POA: Diagnosis not present

## 2019-04-18 ENCOUNTER — Other Ambulatory Visit: Payer: Self-pay | Admitting: Obstetrics & Gynecology

## 2019-04-18 DIAGNOSIS — Z1231 Encounter for screening mammogram for malignant neoplasm of breast: Secondary | ICD-10-CM

## 2019-05-09 DIAGNOSIS — Z23 Encounter for immunization: Secondary | ICD-10-CM | POA: Diagnosis not present

## 2019-06-04 ENCOUNTER — Ambulatory Visit
Admission: RE | Admit: 2019-06-04 | Discharge: 2019-06-04 | Disposition: A | Discharge status: Home / Self Care | Payer: Medicare HMO | Source: Ambulatory Visit | Attending: Obstetrics & Gynecology | Admitting: Obstetrics & Gynecology

## 2019-06-04 ENCOUNTER — Other Ambulatory Visit: Payer: Self-pay

## 2019-06-04 DIAGNOSIS — Z1231 Encounter for screening mammogram for malignant neoplasm of breast: Secondary | ICD-10-CM | POA: Diagnosis not present

## 2019-06-09 DIAGNOSIS — E7800 Pure hypercholesterolemia, unspecified: Secondary | ICD-10-CM | POA: Diagnosis not present

## 2019-06-09 DIAGNOSIS — Z0000 Encounter for general adult medical examination without abnormal findings: Secondary | ICD-10-CM | POA: Diagnosis not present

## 2019-06-16 DIAGNOSIS — M8580 Other specified disorders of bone density and structure, unspecified site: Secondary | ICD-10-CM | POA: Diagnosis not present

## 2019-06-16 DIAGNOSIS — E7800 Pure hypercholesterolemia, unspecified: Secondary | ICD-10-CM | POA: Diagnosis not present

## 2019-06-16 DIAGNOSIS — Z780 Asymptomatic menopausal state: Secondary | ICD-10-CM | POA: Diagnosis not present

## 2019-06-16 DIAGNOSIS — Z0000 Encounter for general adult medical examination without abnormal findings: Secondary | ICD-10-CM | POA: Diagnosis not present

## 2019-08-19 ENCOUNTER — Ambulatory Visit: Payer: Medicare HMO

## 2019-08-25 ENCOUNTER — Other Ambulatory Visit: Payer: Self-pay

## 2019-08-25 NOTE — Progress Notes (Signed)
69 y.o. G3P3 Married  Caucasian Fe here for annual exam. Menopausal denies vaginal bleeding, no vaginal dryness. Walks daily and does leg exercises. Eating well and has good calcium intake. Sees PCP for aex, labs, all normal. Has taken Covid Vaccine with no issues at this point. Cares for both sets of parents. Spouse to have hip replacement soon. No health issues today.  Patient's last menstrual period was 11/17/2005.          Sexually active: Yes.    The current method of family planning is tubal ligation.    Exercising: Yes.    leg exercises, walking Smoker:  no  Review of Systems  Constitutional: Negative.   HENT: Negative.   Eyes: Negative.   Respiratory: Negative.   Cardiovascular: Negative.   Gastrointestinal: Negative.   Genitourinary: Negative.   Musculoskeletal: Negative.   Skin: Negative.   Neurological: Negative.   Endo/Heme/Allergies: Negative.   Psychiatric/Behavioral: Negative.     Health Maintenance: Pap:  08-04-16 neg, 08-21-2018 neg HPV HR neg History of Abnormal Pap: no MMG:  06-04-2019 category d density birads 1:neg Self Breast exams: occ Colonoscopy:  2018 f/u 54yrs BMD:   2018 osteopenia TDaP:  2013 Shingles: had 1st one 05/2019 Pneumonia: 2018 Hep C and HIV: both neg per patient Labs: no   reports that she has never smoked. She has never used smokeless tobacco. She reports that she does not drink alcohol or use drugs.  Past Medical History:  Diagnosis Date  . Back pain   . Enlarged thyroid 2012   2 nodules, needle biopsy neg  . Migraines    between 1980-1983  . Osteopenia   . Osteopenia   . Pleurisy 1986  . Pneumonia   . Shingles     Past Surgical History:  Procedure Laterality Date  . COLONOSCOPY W/ POLYPECTOMY  2002  . DILATION AND CURETTAGE OF UTERUS  1993  . TONSILLECTOMY AND ADENOIDECTOMY  1959  . TUBAL LIGATION  1993    Current Outpatient Medications  Medication Sig Dispense Refill  . CALCIUM PO Take 600 mg by mouth 2 (two)  times daily. + d    . Cholecalciferol (VITAMIN D PO) Take 3,000 Int'l Units by mouth daily.     . Multiple Vitamins-Minerals (MULTIVITAMIN PO) Take by mouth daily.    . TURMERIC CURCUMIN PO Take 500 mg by mouth daily.      No current facility-administered medications for this visit.    Family History  Problem Relation Age of Onset  . Asthma Maternal Aunt   . Heart disease Maternal Grandfather   . Cancer Paternal Grandfather        colon  . Diabetes Maternal Grandmother   . Heart disease Father        bypass  . Breast cancer Neg Hx     ROS:  Pertinent items are noted in HPI.  Otherwise, a comprehensive ROS was negative.  Exam:   LMP 11/17/2005    Ht Readings from Last 3 Encounters:  08/21/18 5' 7.25" (1.708 m)  08/10/17 5' 7.25" (1.708 m)  08/04/16 5' 7.5" (1.715 m)    General appearance: alert, cooperative and appears stated age Head: Normocephalic, without obvious abnormality, atraumatic Neck: no adenopathy, supple, symmetrical, trachea midline and thyroid normal to inspection and palpation Lungs: clear to auscultation bilaterally Breasts: normal appearance, no masses or tenderness, No nipple retraction or dimpling, No nipple discharge or bleeding, No axillary or supraclavicular adenopathy Heart: regular rate and rhythm Abdomen: soft, non-tender; no masses,  no organomegaly Extremities: extremities normal, atraumatic, no cyanosis or edema Skin: Skin color, texture, turgor normal. No rashes or lesions Lymph nodes: Cervical, supraclavicular, and axillary nodes normal. No abnormal inguinal nodes palpated Neurologic: Grossly normal   Pelvic: External genitalia:  no lesions              Urethra:  normal appearing urethra with no masses, tenderness or lesions              Bartholin's and Skene's: normal                 Vagina: normal appearing vagina with normal color and discharge, no lesions              Cervix: no cervical motion tenderness, no lesions and normal  appearance              Pap taken: No. Bimanual Exam:  Uterus:  normal size, contour, position, consistency, mobility, non-tender and anteverted              Adnexa: normal adnexa and no mass, fullness, tenderness               Rectovaginal: Confirms               Anus:  normal sphincter tone, no lesions  Chaperone present: yes  A:  Well Woman with normal exam  Menopausal no HRT  Osteopenia BMD with PCP management  Cares for both sets of parents    P:   Reviewed health and wellness pertinent to exam.  Aware of need to advise if vaginal bleeding.  Continue follow up with BMD with PCP as indicated  Encouraged to take time for self  Pap smear: no   counseled on breast self exam, mammography screening, feminine hygiene, adequate intake of calcium and vitamin D, diet and exercise, Kegel's exercises  return annually or prn  An After Visit Summary was printed and given to the patient.

## 2019-08-26 ENCOUNTER — Ambulatory Visit (INDEPENDENT_AMBULATORY_CARE_PROVIDER_SITE_OTHER): Payer: Medicare HMO | Admitting: Certified Nurse Midwife

## 2019-08-26 ENCOUNTER — Encounter: Payer: Self-pay | Admitting: Certified Nurse Midwife

## 2019-08-26 VITALS — BP 110/72 | HR 68 | Temp 97.0°F | Resp 16 | Ht 67.0 in | Wt 155.0 lb

## 2019-08-26 DIAGNOSIS — Z780 Asymptomatic menopausal state: Secondary | ICD-10-CM

## 2019-08-26 DIAGNOSIS — Z01419 Encounter for gynecological examination (general) (routine) without abnormal findings: Secondary | ICD-10-CM

## 2019-08-26 DIAGNOSIS — Z8739 Personal history of other diseases of the musculoskeletal system and connective tissue: Secondary | ICD-10-CM

## 2019-08-26 NOTE — Patient Instructions (Signed)
EXERCISE AND DIET:  We recommended that you start or continue a regular exercise program for good health. Regular exercise means any activity that makes your heart beat faster and makes you sweat.  We recommend exercising at least 30 minutes per day at least 3 days a week, preferably 4 or 5.  We also recommend a diet low in fat and sugar.  Inactivity, poor dietary choices and obesity can cause diabetes, heart attack, stroke, and kidney damage, among others.    ALCOHOL AND SMOKING:  Women should limit their alcohol intake to no more than 7 drinks/beers/glasses of wine (combined, not each!) per week. Moderation of alcohol intake to this level decreases your risk of breast cancer and liver damage. And of course, no recreational drugs are part of a healthy lifestyle.  And absolutely no smoking or even second hand smoke. Most people know smoking can cause heart and lung diseases, but did you know it also contributes to weakening of your bones? Aging of your skin?  Yellowing of your teeth and nails?  CALCIUM AND VITAMIN D:  Adequate intake of calcium and Vitamin D are recommended.  The recommendations for exact amounts of these supplements seem to change often, but generally speaking 600 mg of calcium (either carbonate or citrate) and 800 units of Vitamin D per day seems prudent. Certain women may benefit from higher intake of Vitamin D.  If you are among these women, your doctor will have told you during your visit.    PAP SMEARS:  Pap smears, to check for cervical cancer or precancers,  have traditionally been done yearly, although recent scientific advances have shown that most women can have pap smears less often.  However, every woman still should have a physical exam from her gynecologist every year. It will include a breast check, inspection of the vulva and vagina to check for abnormal growths or skin changes, a visual exam of the cervix, and then an exam to evaluate the size and shape of the uterus and  ovaries.  And after 67 years of age, a rectal exam is indicated to check for rectal cancers. We will also provide age appropriate advice regarding health maintenance, like when you should have certain vaccines, screening for sexually transmitted diseases, bone density testing, colonoscopy, mammograms, etc.   MAMMOGRAMS:  All women over 40 years old should have a yearly mammogram. Many facilities now offer a "3D" mammogram, which may cost around $50 extra out of pocket. If possible,  we recommend you accept the option to have the 3D mammogram performed.  It both reduces the number of women who will be called back for extra views which then turn out to be normal, and it is better than the routine mammogram at detecting truly abnormal areas.    COLONOSCOPY:  Colonoscopy to screen for colon cancer is recommended for all women at age 50.  We know, you hate the idea of the prep.  We agree, BUT, having colon cancer and not knowing it is worse!!  Colon cancer so often starts as a polyp that can be seen and removed at colonscopy, which can quite literally save your life!  And if your first colonoscopy is normal and you have no family history of colon cancer, most women don't have to have it again for 10 years.  Once every ten years, you can do something that may end up saving your life, right?  We will be happy to help you get it scheduled when you are ready.    Be sure to check your insurance coverage so you understand how much it will cost.  It may be covered as a preventative service at no cost, but you should check your particular policy.      Bone Health Bones protect organs, store calcium, anchor muscles, and support the whole body. Keeping your bones strong is important, especially as you get older. You can take actions to help keep your bones strong and healthy. Why is keeping my bones healthy important?  Keeping your bones healthy is important because your body constantly replaces bone cells. Cells get  old, and new cells take their place. As we age, we lose bone cells because the body may not be able to make enough new cells to replace the old cells. The amount of bone cells and bone tissue you have is referred to as bone mass. The higher your bone mass, the stronger your bones. The aging process leads to an overall loss of bone mass in the body, which can increase the likelihood of:  Joint pain and stiffness.  Broken bones.  A condition in which the bones become weak and brittle (osteoporosis). A large decline in bone mass occurs in older adults. In women, it occurs about the time of menopause. What actions can I take to keep my bones healthy? Good health habits are important for maintaining healthy bones. This includes eating nutritious foods and exercising regularly. To have healthy bones, you need to get enough of the right minerals and vitamins. Most nutrition experts recommend getting these nutrients from the foods that you eat. In some cases, taking supplements may also be recommended. Doing certain types of exercise is also important for bone health. What are the nutritional recommendations for healthy bones?  Eating a well-balanced diet with plenty of calcium and vitamin D will help to protect your bones. Nutritional recommendations vary from person to person. Ask your health care provider what is healthy for you. Here are some general guidelines. Get enough calcium Calcium is the most important (essential) mineral for bone health. Most people can get enough calcium from their diet, but supplements may be recommended for people who are at risk for osteoporosis. Good sources of calcium include:  Dairy products, such as low-fat or nonfat milk, cheese, and yogurt.  Dark green leafy vegetables, such as bok choy and broccoli.  Calcium-fortified foods, such as orange juice, cereal, bread, soy beverages, and tofu products.  Nuts, such as almonds. Follow these recommended amounts for daily  calcium intake:  Children, age 67-3: 700 mg.  Children, age 553-8: 1,000 mg.  Children, age 55374-13: 1,300 mg.  Teens, age 58-18: 1,300 mg.  Adults, age 38-50: 1,000 mg.  Adults, age 98-70: ? Men: 1,000 mg. ? Women: 1,200 mg.  Adults, age 55372 or older: 1,200 mg.  Pregnant and breastfeeding females: ? Teens: 1,300 mg. ? Adults: 1,000 mg. Get enough vitamin D Vitamin D is the most essential vitamin for bone health. It helps the body absorb calcium. Sunlight stimulates the skin to make vitamin D, so be sure to get enough sunlight. If you live in a cold climate or you do not get outside often, your health care provider may recommend that you take vitamin D supplements. Good sources of vitamin D in your diet include:  Egg yolks.  Saltwater fish.  Milk and cereal fortified with vitamin D. Follow these recommended amounts for daily vitamin D intake:  Children and teens, age 67-18: 600 international units.  Adults, age 36 or younger:  400-800 international units.  Adults, age 85 or older: 800-1,000 international units. Get other important nutrients Other nutrients that are important for bone health include:  Phosphorus. This mineral is found in meat, poultry, dairy foods, nuts, and legumes. The recommended daily intake for adult men and adult women is 700 mg.  Magnesium. This mineral is found in seeds, nuts, dark green vegetables, and legumes. The recommended daily intake for adult men is 400-420 mg. For adult women, it is 310-320 mg.  Vitamin K. This vitamin is found in green leafy vegetables. The recommended daily intake is 120 mg for adult men and 90 mg for adult women. What type of physical activity is best for building and maintaining healthy bones? Weight-bearing and strength-building activities are important for building and maintaining healthy bones. Weight-bearing activities cause muscles and bones to work against gravity. Strength-building activities increase the strength of the  muscles that support bones. Weight-bearing and muscle-building activities include:  Walking and hiking.  Jogging and running.  Dancing.  Gym exercises.  Lifting weights.  Tennis and racquetball.  Climbing stairs.  Aerobics. Adults should get at least 30 minutes of moderate physical activity on most days. Children should get at least 60 minutes of moderate physical activity on most days. Ask your health care provider what type of exercise is best for you. How can I find out if my bone mass is low? Bone mass can be measured with an X-ray test called a bone mineral density (BMD) test. This test is recommended for all women who are age 68 or older. It may also be recommended for:  Men who are age 12 or older.  People who are at risk for osteoporosis because of: ? Having bones that break easily. ? Having a long-term disease that weakens bones, such as kidney disease or rheumatoid arthritis. ? Having menopause earlier than normal. ? Taking medicine that weakens bones, such as steroids, thyroid hormones, or hormone treatment for breast cancer or prostate cancer. ? Smoking. ? Drinking three or more alcoholic drinks a day. If you find that you have a low bone mass, you may be able to prevent osteoporosis or further bone loss by changing your diet and lifestyle. Where can I find more information? For more information, check out the following websites:  National Osteoporosis Foundation: https://carlson-fletcher.info/  [employer]: www.bones.http://www.myers.net/  International Osteoporosis Foundation: Investment banker, operational.iofbonehealth.org Summary  The aging process leads to an overall loss of bone mass in the body, which can increase the likelihood of broken bones and osteoporosis.  Eating a well-balanced diet with plenty of calcium and vitamin D will help to protect your bones.  Weight-bearing and strength-building activities are also important for building and maintaining strong bones.  Bone mass can  be measured with an X-ray test called a bone mineral density (BMD) test. This information is not intended to replace advice given to you by your health care provider. Make sure you discuss any questions you have with your health care provider. Document Revised: 08/06/2017 Document Reviewed: 08/06/2017 Elsevier Patient Education  2020 [employer].

## 2019-08-28 ENCOUNTER — Ambulatory Visit: Payer: Medicare HMO | Attending: Internal Medicine

## 2019-08-28 DIAGNOSIS — Z23 Encounter for immunization: Secondary | ICD-10-CM

## 2019-08-28 NOTE — Progress Notes (Signed)
   Covid-19 Vaccination Clinic  Name:  Taylor Anderson    MRN: [id number] DOB: 1951-02-08  08/28/2019  Taylor Anderson was observed post Covid-19 immunization for 15 minutes without incidence. She was provided with Vaccine Information Sheet and instruction to access the V-Safe system.   Taylor Anderson was instructed to call 911 with any severe reactions post vaccine: Marland Kitchen Difficulty breathing  . Swelling of your face and throat  . A fast heartbeat  . A bad rash all over your body  . Dizziness and weakness    Immunizations Administered    Name Date Dose VIS Date Route   Pfizer COVID-19 Vaccine 08/28/2019 10:34 AM 0.3 mL 07/04/2019 Intramuscular   Manufacturer: [employer], [employer]   Lot: [id number]   NDC: [id number]

## 2019-09-05 ENCOUNTER — Ambulatory Visit: Payer: Medicare HMO

## 2019-09-23 ENCOUNTER — Ambulatory Visit: Payer: Medicare HMO | Attending: Internal Medicine

## 2019-09-23 DIAGNOSIS — Z23 Encounter for immunization: Secondary | ICD-10-CM

## 2019-09-23 NOTE — Progress Notes (Signed)
   Covid-19 Vaccination Clinic  Name:  Taylor Anderson    MRN: [id number] DOB: 18-Dec-1950  09/23/2019  Taylor Anderson was observed post Covid-19 immunization for 15 minutes without incident. She was provided with Vaccine Information Sheet and instruction to access the V-Safe system.   Taylor Anderson was instructed to call 911 with any severe reactions post vaccine: Marland Kitchen Difficulty breathing  . Swelling of face and throat  . A fast heartbeat  . A bad rash all over body  . Dizziness and weakness   Immunizations Administered    Name Date Dose VIS Date Route   Pfizer COVID-19 Vaccine 09/23/2019  9:06 AM 0.3 mL 07/04/2019 Intramuscular   Manufacturer: [employer], [employer]   Lot: [id number]   NDC: [address]-9

## 2019-10-14 ENCOUNTER — Encounter: Payer: Self-pay | Admitting: Certified Nurse Midwife

## 2020-07-05 ENCOUNTER — Other Ambulatory Visit: Payer: Self-pay | Admitting: Obstetrics & Gynecology

## 2020-07-05 DIAGNOSIS — Z1231 Encounter for screening mammogram for malignant neoplasm of breast: Secondary | ICD-10-CM

## 2020-08-16 ENCOUNTER — Other Ambulatory Visit: Payer: Self-pay

## 2020-08-16 ENCOUNTER — Ambulatory Visit
Admission: RE | Admit: 2020-08-16 | Discharge: 2020-08-16 | Disposition: A | Discharge status: Home / Self Care | Payer: Medicare HMO | Source: Ambulatory Visit | Attending: Obstetrics & Gynecology | Admitting: Obstetrics & Gynecology

## 2020-08-16 DIAGNOSIS — Z1231 Encounter for screening mammogram for malignant neoplasm of breast: Secondary | ICD-10-CM

## 2020-08-27 ENCOUNTER — Ambulatory Visit: Payer: Medicare HMO | Admitting: Certified Nurse Midwife

## 2020-08-27 NOTE — Progress Notes (Signed)
70 y.o. G3P3 Married Caucasian female here for breast & pelvic exam.      Patient's last menstrual period was 11/17/2005.          Sexually active: Yes.    The current method of family planning is tubal ligation.    Exercising: Yes.    exercises daily Smoker:  no  Health Maintenance: Pap:  08-21-2018 neg HPV HR neg History of abnormal Pap:  no MMG:  08-16-2020 category c density birads 1:neg Colonoscopy:  2018 f/u 25yrs BMD:   06-22-2020 osteopenia at [facility] TDaP:  2013 Gardasil:   n/a Covid-19: pfizer Hep C testing: neg per patient Screening Labs: n/a, with PCP   reports that she has never smoked. She has never used smokeless tobacco. She reports that she does not drink alcohol and does not use drugs.  Past Medical History:  Diagnosis Date  . Back pain   . Enlarged thyroid 2012   2 nodules, needle biopsy neg  . Migraines    between 1980-1983  . Osteopenia   . Osteopenia   . Pleurisy 1986  . Pneumonia   . Shingles     Past Surgical History:  Procedure Laterality Date  . COLONOSCOPY W/ POLYPECTOMY  2002  . DILATION AND CURETTAGE OF UTERUS  1993  . TONSILLECTOMY AND ADENOIDECTOMY  1959  . TUBAL LIGATION  1993    Current Outpatient Medications  Medication Sig Dispense Refill  . Calcium Citrate-Vitamin D (CALCIUM + D PO) 1 tablet with food    . Cholecalciferol (VITAMIN D PO) Take 4,000 Int'l Units by mouth daily.     . Multiple Vitamins-Minerals (MULTIVITAMIN PO) Take by mouth daily.    . TURMERIC CURCUMIN PO Take 500 mg by mouth daily.      No current facility-administered medications for this visit.    Family History  Problem Relation Age of Onset  . Asthma Maternal Aunt   . Heart disease Maternal Grandfather   . Cancer Paternal Grandfather        colon  . Diabetes Maternal Grandmother   . Heart disease Father        bypass  . Breast cancer Neg Hx     Review of Systems  Constitutional: Negative.   HENT: Negative.   Eyes:  Negative.   Respiratory: Negative.   Cardiovascular: Negative.   Gastrointestinal: Negative.   Endocrine: Negative.   Genitourinary: Negative.   Musculoskeletal: Negative.   Skin: Negative.   Allergic/Immunologic: Negative.   Neurological: Negative.   Hematological: Negative.   Psychiatric/Behavioral: Negative.     Exam:   BP 108/64   Pulse 68   Resp 16   Ht 5' 6.75" (1.695 m)   Wt 157 lb (71.2 kg)   LMP 11/17/2005   BMI 24.77 kg/m   Height: 5' 6.75" (169.5 cm)  General appearance: alert, cooperative and appears stated age, no acute distress Head: Normocephalic, without obvious abnormality Neck: no adenopathy, thyroid normal to inspection and palpation and small nodule right side Lungs: clear to auscultation bilaterally Breasts: No axillary or supraclavicular adenopathy, Normal to palpation without dominant masses Heart: regular rate and rhythm Abdomen: soft, non-tender; no masses,  no organomegaly Extremities: extremities normal, no edema Skin: No rashes or lesions Lymph nodes: Cervical, supraclavicular, and axillary nodes normal. No abnormal inguinal nodes palpated Neurologic: Grossly normal   Pelvic: External genitalia:  no lesions              Urethra:  normal appearing urethra with no  masses, tenderness or lesions              Bartholins and Skenes: normal                 Vagina: normal appearing vagina, appropriate for age, normal appearing discharge, no lesions              Cervix: neg cervical motion tenderness, no visible lesions             Bimanual Exam:   Uterus:  normal size, contour, position, consistency, mobility, non-tender              Adnexa: no mass, fullness, tenderness               Rectal: no palpable mass   Chaperone was present for exam.  A:  Well Woman with normal exam  P:   Pap :cotesting due 2025  Mammogram: due 07/2021  Labs:with PCP  Medications: no new  Dexa: osteopenia 2021, taking calcium, vit D

## 2020-08-30 ENCOUNTER — Ambulatory Visit: Payer: Medicare HMO

## 2020-08-30 ENCOUNTER — Encounter: Payer: Self-pay | Admitting: Nurse Practitioner

## 2020-08-30 ENCOUNTER — Ambulatory Visit: Payer: Medicare HMO | Admitting: Nurse Practitioner

## 2020-08-30 ENCOUNTER — Other Ambulatory Visit: Payer: Self-pay

## 2020-08-30 VITALS — BP 108/64 | HR 68 | Resp 16 | Ht 66.75 in | Wt 157.0 lb

## 2020-08-30 DIAGNOSIS — Z01419 Encounter for gynecological examination (general) (routine) without abnormal findings: Secondary | ICD-10-CM | POA: Diagnosis not present

## 2020-08-30 NOTE — Patient Instructions (Signed)

## 2021-07-26 ENCOUNTER — Other Ambulatory Visit: Payer: Self-pay | Admitting: Obstetrics and Gynecology

## 2021-07-26 DIAGNOSIS — Z1231 Encounter for screening mammogram for malignant neoplasm of breast: Secondary | ICD-10-CM

## 2021-07-28 ENCOUNTER — Ambulatory Visit: Payer: Medicare HMO | Admitting: Nurse Practitioner

## 2021-08-18 ENCOUNTER — Ambulatory Visit
Admission: RE | Admit: 2021-08-18 | Discharge: 2021-08-18 | Disposition: A | Discharge status: Home / Self Care | Payer: Medicare HMO | Source: Ambulatory Visit | Attending: Obstetrics and Gynecology | Admitting: Obstetrics and Gynecology

## 2021-08-18 DIAGNOSIS — Z1231 Encounter for screening mammogram for malignant neoplasm of breast: Secondary | ICD-10-CM

## 2022-03-14 IMAGING — MG MM DIGITAL SCREENING BILAT W/ TOMO AND CAD
8 series · 9 of 24 positions shown · non-contrast
Comparison: Previous exam(s).

CLINICAL DATA: Screening.

EXAM:
DIGITAL SCREENING BILATERAL MAMMOGRAM WITH TOMOSYNTHESIS AND CAD
TECHNIQUE: Bilateral screening digital craniocaudal and mediolateral oblique
mammograms were obtained. Bilateral screening digital breast
tomosynthesis was performed. The images were evaluated with
computer-aided detection.

[R CC synth-2D]
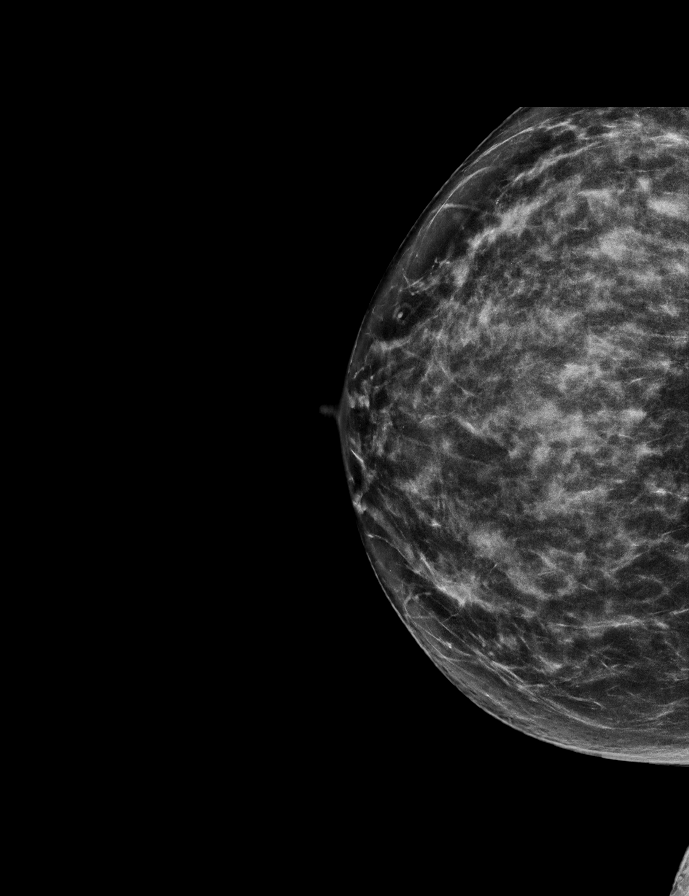

[R MLO synth-2D]
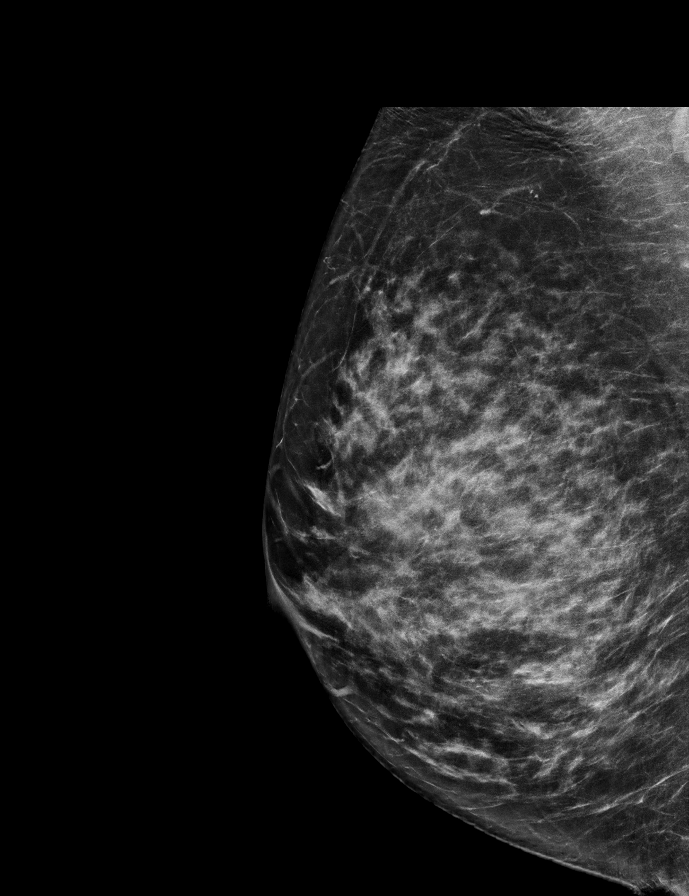

[L MLO synth-2D]
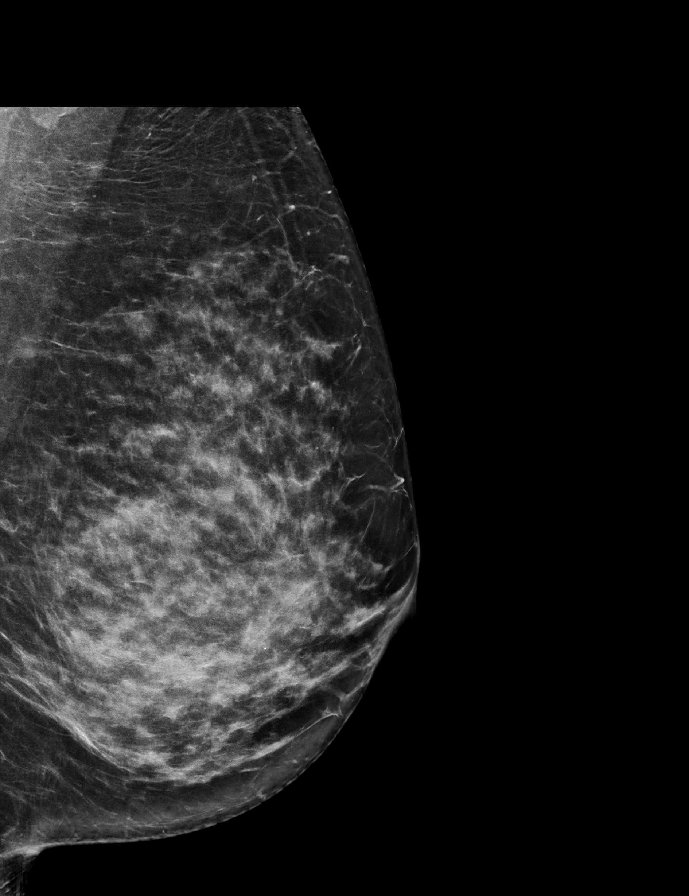

[L CC synth-2D]
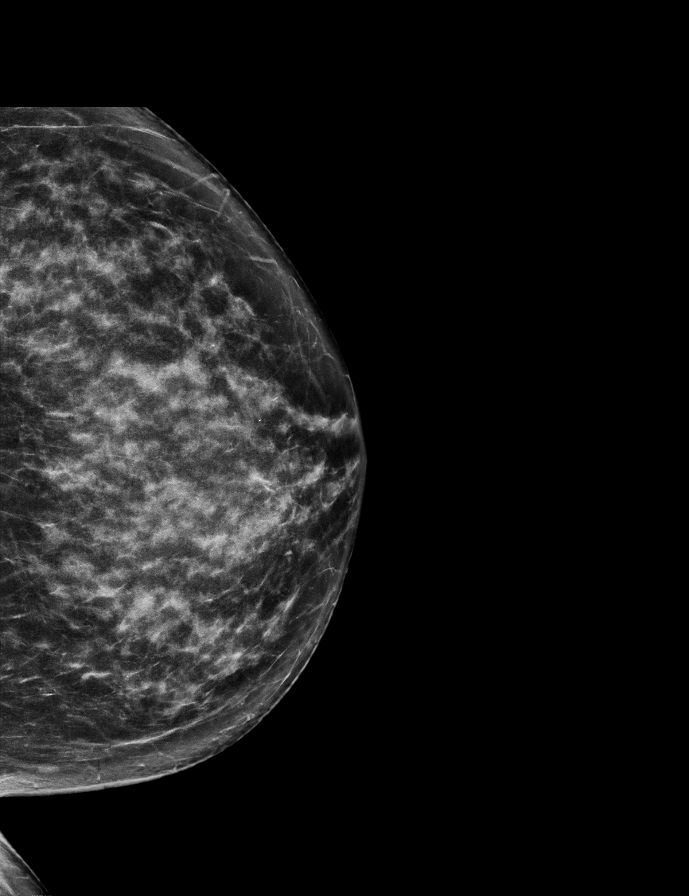

[L CC tomo · 2 of 66 frames shown]
[frame 22/66]
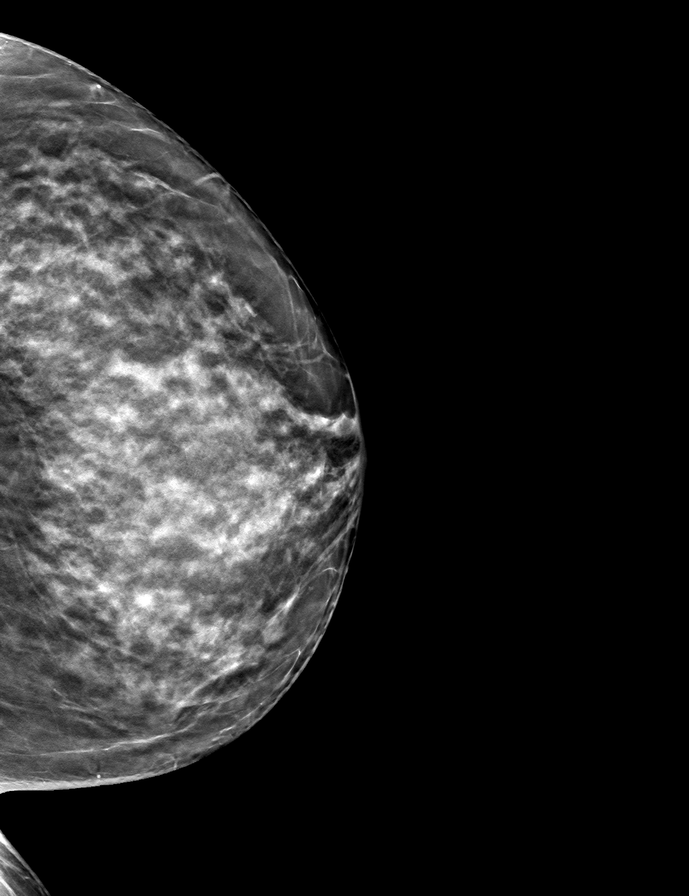
[frame 33/66]
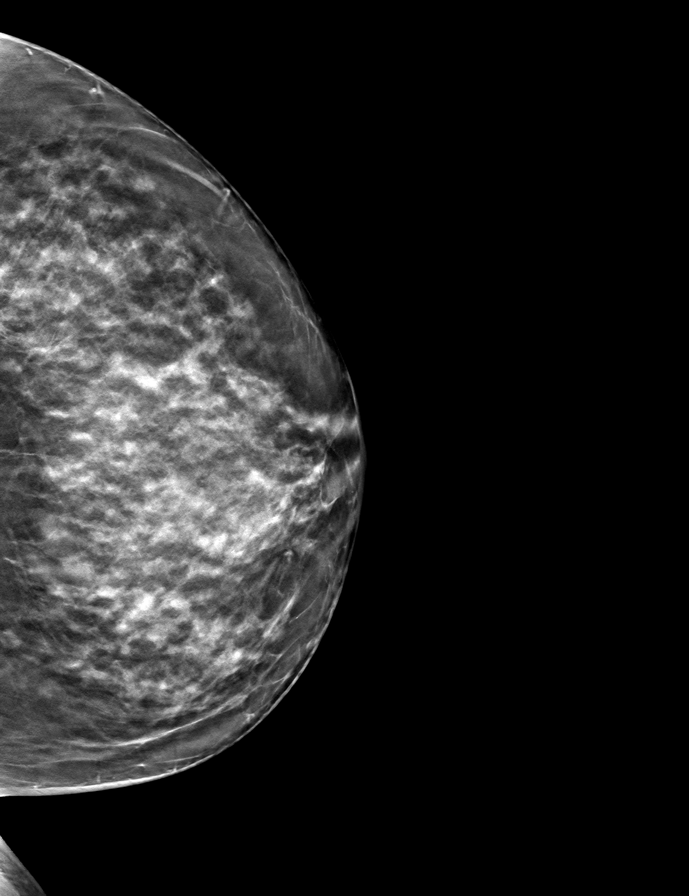

[R MLO tomo · tomo slice 37/72.0]
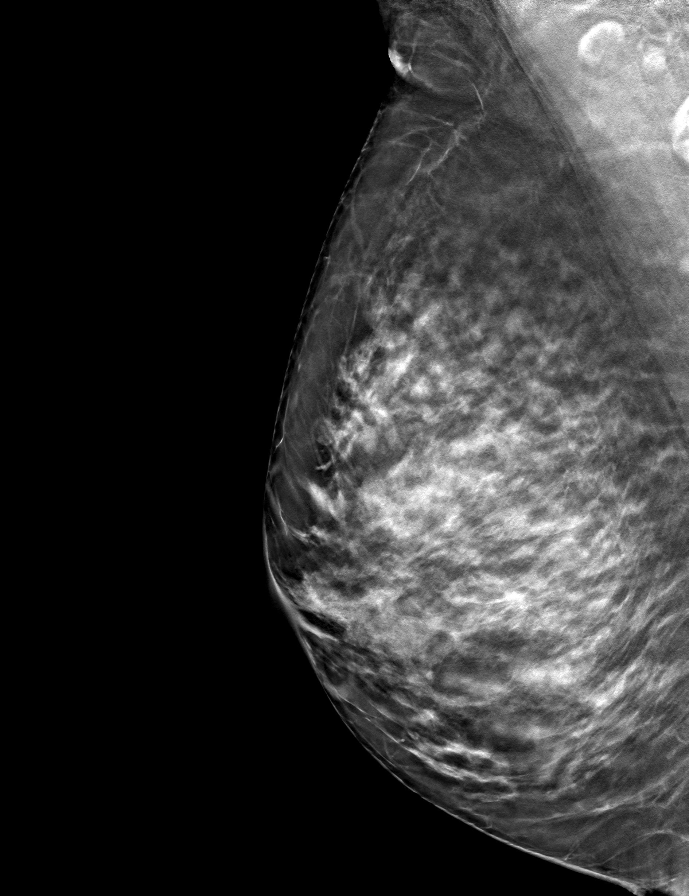

[L MLO tomo · tomo slice 36/71.0]
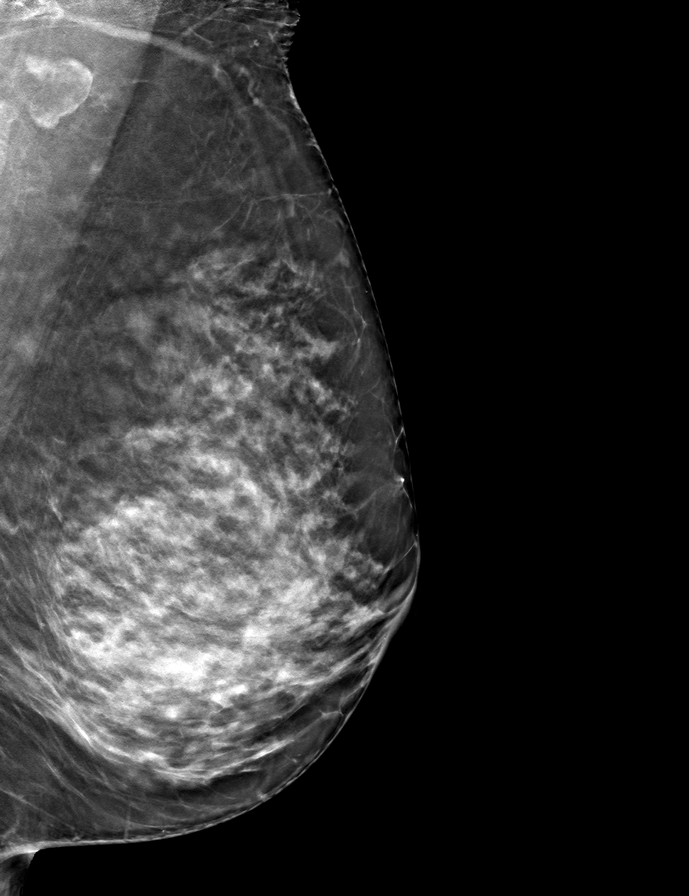

[R CC tomo · tomo slice 35/68.0]
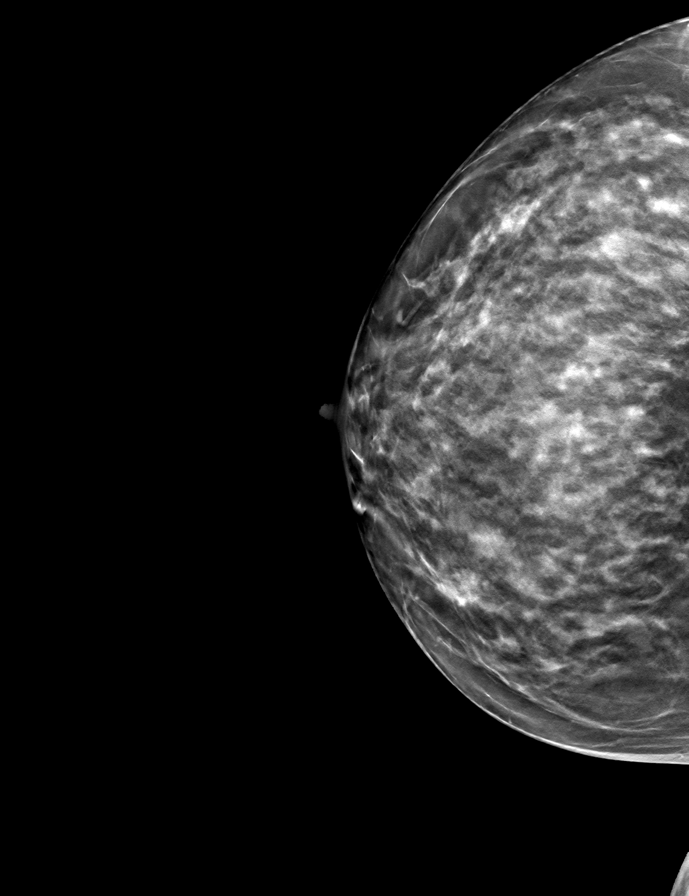

[9 of 24 positions shown; findings below may reference images not displayed]

ACR Breast Density Category c: The breast tissue is heterogeneously
dense, which may obscure small masses.
FINDINGS: There are no findings suspicious for malignancy.
IMPRESSION: No mammographic evidence of malignancy. A result letter of this
screening mammogram will be mailed directly to the patient.

RECOMMENDATION:
Screening mammogram in one year. (Code:Q3-W-BC3)

BI-RADS CATEGORY  1: Negative.

## 2022-06-03 ENCOUNTER — Other Ambulatory Visit: Payer: Self-pay

## 2022-06-03 ENCOUNTER — Encounter (HOSPITAL_BASED_OUTPATIENT_CLINIC_OR_DEPARTMENT_OTHER): Payer: Self-pay

## 2022-06-03 DIAGNOSIS — W57XXXA Bitten or stung by nonvenomous insect and other nonvenomous arthropods, initial encounter: Secondary | ICD-10-CM | POA: Insufficient documentation

## 2022-06-03 DIAGNOSIS — S80862A Insect bite (nonvenomous), left lower leg, initial encounter: Secondary | ICD-10-CM | POA: Diagnosis not present

## 2022-06-03 NOTE — ED Triage Notes (Signed)
Pt was raking leaves yesterday and noticed a scratch on LT lower leg. Today it is raised, hard and red. Pt reports poison ivy in the backyard. Pt unsure if a insect bit her. Pt also reports chills and RT upper mouth swelling and headache. Pt feels "off".

## 2022-06-04 ENCOUNTER — Emergency Department (HOSPITAL_BASED_OUTPATIENT_CLINIC_OR_DEPARTMENT_OTHER)
Admission: EM | Admit: 2022-06-04 | Discharge: 2022-06-04 | Disposition: A | Discharge status: Home / Self Care | Payer: Medicare HMO | Attending: Emergency Medicine | Admitting: Emergency Medicine

## 2022-06-04 DIAGNOSIS — W57XXXA Bitten or stung by nonvenomous insect and other nonvenomous arthropods, initial encounter: Secondary | ICD-10-CM

## 2022-06-04 MED ORDER — DOXYCYCLINE HYCLATE 100 MG PO CAPS: 100.0000 mg | ORAL_CAPSULE | Freq: Two times a day (BID) | ORAL | 0 refills | Status: DC

## 2022-06-04 NOTE — ED Provider Notes (Signed)
[facility] EMERGENCY DEPARTMENT Provider Note   CSN: [id number] Arrival date & time: 06/03/22  2248     History  Chief Complaint  Patient presents with   Rash    Taylor Anderson is a 71 y.o. female.  The history is provided by the patient and medical records.  Rash Taylor Anderson is a 71 y.o. female who presents to the Emergency Department complaining of rash.  She presents to the emergency department for evaluation of rash to the left ankle.  She states that yesterday she was raking leaves in the yard.  She did not notice any injuries or bites at that time.  Today she noticed pain in the red area to the left distal leg.  Initially it felt hard but after rubbing the area it became softer.  Overall her pain is lessening.  She did have body aches earlier but no fever.  She has no known medical problems and takes no routine medications.  No history of bleeding disorders.  She does see her PCP and has routine lab work performed.     Home Medications Prior to Admission medications   Medication Sig Start Date End Date Taking? Authorizing Provider  doxycycline (VIBRAMYCIN) 100 MG capsule Take 1 capsule (100 mg total) by mouth 2 (two) times daily. 06/04/22  Yes Tilden Fossa, MD  Calcium Citrate-Vitamin D (CALCIUM + D PO) 1 tablet with food    [provider]  Cholecalciferol (VITAMIN D PO) Take 4,000 Int'l Units by mouth daily.     [provider]  Multiple Vitamins-Minerals (MULTIVITAMIN PO) Take by mouth daily.    [provider]  TURMERIC CURCUMIN PO Take 500 mg by mouth daily.     [provider]      Allergies    Gabapentin and Sulfa antibiotics    Review of Systems   Review of Systems  Skin:  Positive for rash.  All other systems reviewed and are negative.   Physical Exam Updated Vital Signs BP (!) 142/79 (BP Location: Right Arm)   Pulse 81   Temp 97.7 F (36.5 C) (Oral)   Resp 18   Ht 5\' 8"  (1.727 m)   Wt 68 kg   LMP  11/17/2005   SpO2 98%   BMI 22.81 kg/m  Physical Exam Vitals and nursing note reviewed.  Constitutional:      Appearance: She is well-developed.  HENT:     Head: Normocephalic and atraumatic.  Cardiovascular:     Rate and Rhythm: Normal rate and regular rhythm.     Heart sounds: No murmur heard. Pulmonary:     Effort: Pulmonary effort is normal. No respiratory distress.     Breath sounds: Normal breath sounds.  Abdominal:     Palpations: Abdomen is soft.     Tenderness: There is no abdominal tenderness. There is no guarding or rebound.  Musculoskeletal:     Comments: 2+ DP pulses bilaterally.  There is an ovoid 1 x 2 cm lesion to the left distal inner leg with erythema and it is mildly firm to palpation.  There is a single punctate region to the lateral aspect of this.  There is no fluctuance.  Skin:    General: Skin is warm and dry.  Neurological:     Mental Status: She is alert and oriented to person, place, and time.  Psychiatric:        Behavior: Behavior normal.     ED Results / Procedures / Treatments   Labs (  all labs ordered are listed, but only abnormal results are displayed) Labs Reviewed - No data to display  EKG None  Radiology No results found.  Procedures Procedures    Medications Ordered in ED Medications - No data to display  ED Course/ Medical Decision Making/ A&P                           Medical Decision Making Risk Prescription drug management.   Patient here for evaluation of rash to her left leg.  Examination is consistent with a local insect bite.  No evidence of tick at this time.  No known recent tick bites.  She does have a slight petechial component to this area but no systemic signs of bleeding, does have routine labs performed on an outpatient basis.  Discussed with patient home care for insect bite.  Will prescribe doxycycline that she is willing to take if she has increased redness or progressive symptoms.  Discussed importance of  outpatient follow-up.  She is scheduled to follow-up with her PCP and have labs checked by the beginning of December.  Discussed continuing to follow-up and seeing if she can have her labs moved up.  Return precautions discussed.       Final Clinical Impression(s) / ED Diagnoses Final diagnoses:  Insect bite of left lower leg, initial encounter    Rx / DC Orders ED Discharge Orders          Ordered    doxycycline (VIBRAMYCIN) 100 MG capsule  2 times daily        06/04/22 0316              Tilden Fossa, MD 06/04/22 0321

## 2022-09-01 ENCOUNTER — Other Ambulatory Visit (HOSPITAL_COMMUNITY)
Admission: RE | Admit: 2022-09-01 | Discharge: 2022-09-01 | Disposition: A | Discharge status: Home / Self Care | Payer: Medicare HMO | Source: Ambulatory Visit | Attending: Obstetrics & Gynecology | Admitting: Obstetrics & Gynecology

## 2022-09-01 ENCOUNTER — Encounter (HOSPITAL_BASED_OUTPATIENT_CLINIC_OR_DEPARTMENT_OTHER): Payer: Self-pay | Admitting: Obstetrics & Gynecology

## 2022-09-01 ENCOUNTER — Ambulatory Visit (HOSPITAL_BASED_OUTPATIENT_CLINIC_OR_DEPARTMENT_OTHER): Payer: Medicare HMO | Admitting: Obstetrics & Gynecology

## 2022-09-01 VITALS — BP 127/70 | HR 76 | Ht 67.0 in | Wt 156.6 lb

## 2022-09-01 DIAGNOSIS — Z124 Encounter for screening for malignant neoplasm of cervix: Secondary | ICD-10-CM | POA: Insufficient documentation

## 2022-09-01 DIAGNOSIS — M8580 Other specified disorders of bone density and structure, unspecified site: Secondary | ICD-10-CM | POA: Insufficient documentation

## 2022-09-01 DIAGNOSIS — Z780 Asymptomatic menopausal state: Secondary | ICD-10-CM

## 2022-09-01 DIAGNOSIS — Z1231 Encounter for screening mammogram for malignant neoplasm of breast: Secondary | ICD-10-CM | POA: Diagnosis not present

## 2022-09-01 NOTE — Progress Notes (Unsigned)
GYNECOLOGY  VISIT  CC:   gyn exam/establishing care  HPI: 72 y.o. G3P3 Married White female here for establishing care.  Denies vaginal bleeding.  Never on HRT.  Requests pap smear.  Had BMD with [facility] medical in January (08/03/2022).  Doesn't know results.  Suggested she contact that office for results.    Last colonoscopy was 2018.  Follow up 10 years.  Done with Dr. Collene Mares.  Hyperplastic polyp.    MMG done 08/18/2021.  MMG order for scheduling will be placed.   Past Medical History:  Diagnosis Date   Back pain    Enlarged thyroid 2012   2 nodules, needle biopsy neg   Migraines    between 1980-1983   Osteopenia    Osteopenia    Pleurisy 1986   Pneumonia    Shingles     MEDS:   Current Outpatient Medications on File Prior to Visit  Medication Sig Dispense Refill   Ascorbic Acid (VITAMIN C) 500 MG CAPS Take by mouth.     Calcium Citrate-Vitamin D (CALCIUM + D PO) 1 tablet with food     Cholecalciferol (VITAMIN D PO) Take 4,000 Int'l Units by mouth daily.      fexofenadine (ALLEGRA) 180 MG tablet Take 180 mg by mouth daily.     Multiple Vitamins-Minerals (MULTIVITAMIN PO) Take by mouth daily.     TURMERIC CURCUMIN PO Take 500 mg by mouth daily.      No current facility-administered medications on file prior to visit.    ALLERGIES: Gabapentin and Sulfa antibiotics  SH:  married, non smoker  Review of Systems  Constitutional: Negative.   Genitourinary: Negative.     PHYSICAL EXAMINATION:    BP 127/70 (BP Location: Right Arm, Patient Position: Sitting, Cuff Size: Large)   Pulse 76   Ht 5' 7"$  (1.702 m) Comment: Reported  Wt 156 lb 9.6 oz (71 kg)   LMP 11/17/2005   BMI 24.53 kg/m     General appearance: alert, cooperative and appears stated age Neck: no adenopathy, supple, symmetrical, trachea midline and thyroid normal to inspection and palpation CV:  Regular rate and rhythm Lungs:  clear to auscultation, no wheezes, rales or rhonchi, symmetric air  entry Breasts: normal appearance, no masses or tenderness Abdomen: soft, non-tender; bowel sounds normal; no masses,  no organomegaly Lymph:  no inguinal LAD noted  Pelvic: External genitalia:  no lesions              Urethra:  normal appearing urethra with no masses, tenderness or lesions              Bartholins and Skenes: normal                 Vagina: normal appearing vagina with normal color and discharge, no lesions              Cervix: no lesions              Bimanual Exam:  Uterus:  normal size, contour, position, consistency, mobility, non-tender              Adnexa: no mass, fullness, tenderness              Rectovaginal: Yes.  .  Confirms.              Anus:  normal sphincter tone, no lesions  Chaperone, Octaviano Batty, CMA, was present for exam.  Assessment/Plan: 1. Postmenopausal - not on HRT  2. Encounter for screening mammogram  for malignant neoplasm of breast - MM 3D SCREEN BREAST BILATERAL; Future  3. Cervical cancer screening - Cytology - PAP( [place]) - PR OBTAINING SCREEN PAP SMEAR

## 2022-09-05 ENCOUNTER — Ambulatory Visit (HOSPITAL_BASED_OUTPATIENT_CLINIC_OR_DEPARTMENT_OTHER)
Admission: RE | Admit: 2022-09-05 | Discharge: 2022-09-05 | Disposition: A | Discharge status: Home / Self Care | Payer: Medicare HMO | Source: Ambulatory Visit | Attending: Obstetrics & Gynecology | Admitting: Obstetrics & Gynecology

## 2022-09-05 DIAGNOSIS — Z1231 Encounter for screening mammogram for malignant neoplasm of breast: Secondary | ICD-10-CM | POA: Diagnosis present

## 2022-09-05 LAB — CYTOLOGY - PAP: Diagnosis: NEGATIVE

## 2022-09-11 ENCOUNTER — Encounter: Payer: Self-pay | Admitting: *Deleted

## 2022-09-20 ENCOUNTER — Encounter (HOSPITAL_BASED_OUTPATIENT_CLINIC_OR_DEPARTMENT_OTHER): Payer: Self-pay | Admitting: Obstetrics & Gynecology

## 2022-09-20 ENCOUNTER — Other Ambulatory Visit (HOSPITAL_BASED_OUTPATIENT_CLINIC_OR_DEPARTMENT_OTHER): Payer: Self-pay | Admitting: Obstetrics & Gynecology

## 2022-09-20 DIAGNOSIS — M25551 Pain in right hip: Secondary | ICD-10-CM

## 2022-11-06 NOTE — Progress Notes (Unsigned)
[name] [address] [place] [address] Phone: [phone] Subjective:   Taylor Anderson, am serving as a scribe for Dr. Antoine Primas.  I'm seeing this patient by the request  of:  Irena Reichmann, DO  CC: right hip pain   [id number]  Taylor Anderson is a 72 y.o. female coming in with complaint of R hip pain. Worse going up steps. Patient states that her pain started in January. She moved 4 relatives throughout the last 6 months of 2023 and she stayed in an old house in December while her son was in the hospital and had to walk up steps in the house she was renting. Does have lumbar spine pain when she gardens but does not have pain daily.   Pain in piriformis. Unable to climb stairs as putting all of her weight on the R leg causes intense pain. Also feels pain after being seated for a while and stands up and takes a few steps.   Patient is working on exercises for her L hip and R knee that her son gave to her as he is a PT. States that her L hip will not move well after she is seated for a prolonged period. Overall feels that R is worse than left. Tried take 10 day course of Advil which helps the L hip and R knee but is not effective with R hip.     Past Medical History:  Diagnosis Date   Back pain    Enlarged thyroid 2012   2 nodules, biopsy showing goiter   Migraines    between 1980-1983   Osteopenia    Pleurisy 1986   Pneumonia    hx of   Shingles    Past Surgical History:  Procedure Laterality Date   COLONOSCOPY W/ POLYPECTOMY  2002   DILATION AND CURETTAGE OF UTERUS  1993   TONSILLECTOMY AND ADENOIDECTOMY  1959   TUBAL LIGATION  1993   Social History   Socioeconomic History   Marital status: Married    Spouse name: Not on file   Number of children: Not on file   Years of education: Not on file   Highest education level: Not on file  Occupational History   Occupation: Former Runner, broadcasting/film/video  Tobacco Use   Smoking status: Never    Smokeless tobacco: Never  Vaping Use   Vaping Use: Never used  Substance and Sexual Activity   Alcohol use: No   Drug use: No   Sexual activity: Yes    Partners: Male    Birth control/protection: Surgical    Comment: btl  Other Topics Concern   Not on file  Social History Narrative   Not on file   Social Determinants of Health   Financial Resource Strain: Not on file  Food Insecurity: Not on file  Transportation Needs: Not on file  Physical Activity: Not on file  Stress: Not on file  Social Connections: Not on file   Allergies  Allergen Reactions   Gabapentin     Chest tightness, sleepiness   Sulfa Antibiotics     rash   Family History  Problem Relation Age of Onset   Asthma Maternal Aunt    Heart disease Maternal Grandfather    Cancer Paternal Grandfather        colon   Diabetes Maternal Grandmother    Heart disease Father        bypass   Breast cancer Neg Hx  Current Outpatient Medications (Respiratory):    fexofenadine (ALLEGRA) 180 MG tablet, Take 180 mg by mouth daily.    Current Outpatient Medications (Other):    Ascorbic Acid (VITAMIN C) 500 MG CAPS, Take by mouth.   Calcium Citrate-Vitamin D (CALCIUM + D PO), 1 tablet with food   Cholecalciferol (VITAMIN D PO), Take 4,000 Int'l Units by mouth daily.    Multiple Vitamins-Minerals (MULTIVITAMIN PO), Take by mouth daily.   TURMERIC CURCUMIN PO, Take 500 mg by mouth daily.    Reviewed prior external information including notes and imaging from  primary care provider As well as notes that were available from care everywhere and other healthcare systems.  Past medical history, social, surgical and family history all reviewed in electronic medical record.  No pertanent information unless stated regarding to the chief complaint.   Review of Systems:  No headache, visual changes, nausea, vomiting, diarrhea, constipation, dizziness, abdominal pain, skin rash, fevers, chills, night sweats, weight  loss, swollen lymph nodes, body aches, joint swelling, chest pain, shortness of breath, mood changes. POSITIVE muscle aches  Objective  Last menstrual period 11/17/2005.   General: No apparent distress alert and oriented x3 mood and affect normal, dressed appropriately.  HEENT: Pupils equal, extraocular movements intact  Respiratory: Patient's speak in full sentences and does not appear short of breath  Cardiovascular: No lower extremity edema, non tender, no erythema  Right hip shows severe tenderness to palpation of the piriformis as well as the gluteal tendons on the lateral aspect of the hip.  No tenderness to the greater trochanteric area that is fairly severe.  Positive FABER test.  Negative straight leg test.   After verbal consent patient was prepped with alcohol swab and with a 21-gauge 2 inch needle injected into the right greater trochanteric area with 2 cc of 0.5% Marcaine and 1 cc of Kenalog 40 mg/mL.  No blood loss.  Band-Aid placed.  Postinjection instructions given  [id number]; 15 additional minutes spent for Therapeutic exercises as stated in above notes.  This included exercises focusing on stretching, strengthening, with significant focus on eccentric aspects.   Long term goals include an improvement in range of motion, strength, endurance as well as avoiding reinjury. Patient's frequency would include in 1-2 times a day, 3-5 times a week for a duration of 6-12 weeks. Hip strengthening exercises which included:  Pelvic tilt/bracing to help with proper recruitment of the lower abs and pelvic floor muscles  Glute strengthening to properly contract glutes without over-engaging low back and hamstrings - prone hip extension and glute bridge exercises Proper stretching techniques to increase effectiveness for the hip flexors, groin, quads, piriformic and low back when appropriate   Proper technique shown and discussed handout in great detail with ATC.  All questions were discussed and  answered.      Impression and Recommendations:     The above documentation has been reviewed and is accurate and complete Judi Saa, DO

## 2022-11-08 ENCOUNTER — Other Ambulatory Visit: Payer: Self-pay

## 2022-11-08 ENCOUNTER — Ambulatory Visit (INDEPENDENT_AMBULATORY_CARE_PROVIDER_SITE_OTHER): Payer: Medicare HMO

## 2022-11-08 ENCOUNTER — Ambulatory Visit: Payer: Medicare HMO | Admitting: Family Medicine

## 2022-11-08 VITALS — BP 118/84 | HR 80 | Ht 67.0 in | Wt 157.0 lb

## 2022-11-08 DIAGNOSIS — M545 Low back pain, unspecified: Secondary | ICD-10-CM

## 2022-11-08 DIAGNOSIS — M25551 Pain in right hip: Secondary | ICD-10-CM | POA: Diagnosis not present

## 2022-11-08 NOTE — Patient Instructions (Addendum)
Exercises Xray today GT injection See me again in 6-8 weeks

## 2022-11-09 DIAGNOSIS — M25551 Pain in right hip: Secondary | ICD-10-CM | POA: Insufficient documentation

## 2022-11-09 NOTE — Assessment & Plan Note (Signed)
Patient given injection and tolerated the procedure well, discussed icing regimen and home exercises, which activities to do and which ones to avoid.  Discussed topical anti-inflammatories.  Discussed icing regimen.  Follow-up again after the injection, home exercises and icing protocol as well as topical anti-inflammatories in 6 to 8 weeks to see how patient is responding.  Differential includes lumbar radiculopathy but I do not think that this is likely.  X-rays are pending

## 2022-12-26 NOTE — Progress Notes (Unsigned)
[facility] [address] [place] [address] Phone: [phone] Subjective:   Taylor Anderson, am serving as a scribe for Dr. Antoine Anderson.  I'm seeing this patient by the request  of:  Taylor Reichmann, DO  CC: right hip and low back pain   BJY:[id number]  11/08/2022 Patient given injection and tolerated the procedure well, discussed icing regimen and home exercises, which activities to do and which ones to avoid.  Discussed topical anti-inflammatories.  Discussed icing regimen.  Follow-up again after the injection, home exercises and icing protocol as well as topical anti-inflammatories in 6 to 8 weeks to see how patient is responding.  Differential includes lumbar radiculopathy but I do not think that this is likely.  X-rays are pending      Update 12/27/2022 Taylor Anderson is a 72 y.o. female coming in with complaint of R hip and lumbar spine pain. GT injection given at last exam. Patient states that she can go up steps without holding but still has pain with hip flexion. Has been diligent about doing exercises. Did not have relief from hip injection. Takes 3 Advil and that will give her relief for 3 days.        Past Medical History:  Diagnosis Date   Back pain    Enlarged thyroid 2012   2 nodules, biopsy showing goiter   Migraines    between 1980-1983   Osteopenia    Pleurisy 1986   Pneumonia    hx of   Shingles    Past Surgical History:  Procedure Laterality Date   COLONOSCOPY W/ POLYPECTOMY  2002   DILATION AND CURETTAGE OF UTERUS  1993   TONSILLECTOMY AND ADENOIDECTOMY  1959   TUBAL LIGATION  1993   Social History   Socioeconomic History   Marital status: Married    Spouse name: Not on file   Number of children: Not on file   Years of education: Not on file   Highest education level: Not on file  Occupational History   Occupation: Former Runner, broadcasting/film/video  Tobacco Use   Smoking status: Never   Smokeless tobacco: Never  Vaping Use    Vaping Use: Never used  Substance and Sexual Activity   Alcohol use: No   Drug use: No   Sexual activity: Yes    Partners: Male    Birth control/protection: Surgical    Comment: btl  Other Topics Concern   Not on file  Social History Narrative   Not on file   Social Determinants of Health   Financial Resource Strain: Not on file  Food Insecurity: Not on file  Transportation Needs: Not on file  Physical Activity: Not on file  Stress: Not on file  Social Connections: Not on file   Allergies  Allergen Reactions   Gabapentin     Chest tightness, sleepiness   Sulfa Antibiotics     rash   Family History  Problem Relation Age of Onset   Asthma Maternal Aunt    Heart disease Maternal Grandfather    Cancer Paternal Grandfather        colon   Diabetes Maternal Grandmother    Heart disease Father        bypass   Breast cancer Neg Hx       Current Outpatient Medications (Respiratory):    fexofenadine (ALLEGRA) 180 MG tablet, Take 180 mg by mouth daily.    Current Outpatient Medications (Other):    Ascorbic Acid (VITAMIN C) 500  MG CAPS, Take by mouth.   Calcium Citrate-Vitamin D (CALCIUM + D PO), 1 tablet with food   Cholecalciferol (VITAMIN D PO), Take 4,000 Int'l Units by mouth daily.    Multiple Vitamins-Minerals (MULTIVITAMIN PO), Take by mouth daily.   TURMERIC CURCUMIN PO, Take 500 mg by mouth daily.    Reviewed prior external information including notes and imaging from  primary care provider As well as notes that were available from care everywhere and other healthcare systems.  Past medical history, social, surgical and family history all reviewed in electronic medical record.  No pertanent information unless stated regarding to the chief complaint.   Review of Systems:  No headache, visual changes, nausea, vomiting, diarrhea, constipation, dizziness, abdominal pain, skin rash, fevers, chills, night sweats, weight loss, swollen lymph nodes, body aches,  joint swelling, chest pain, shortness of breath, mood changes. POSITIVE muscle aches only with going up stairs  Objective  Blood pressure 122/82, pulse 91, height 5\' 7"  (1.702 m), weight 156 lb (70.8 kg), last menstrual period 11/17/2005, SpO2 98 %.   General: No apparent distress alert and oriented x3 mood and affect normal, dressed appropriately.  HEENT: Pupils equal, extraocular movements intact  Respiratory: Patient's speak in full sentences and does not appear short of breath  Cardiovascular: No lower extremity edema, non tender, no erythema  Back exam shows does have some loss of lordosis noted.  Some tenderness to palpation in the paraspinal musculature but very minimal.  More on the gluteal area than anywhere else.  Tightness with FABER test.  Negative straight leg test.    Impression and Recommendations:    The above documentation has been reviewed and is accurate and complete Taylor Saa, DO

## 2022-12-27 ENCOUNTER — Ambulatory Visit: Payer: Medicare HMO | Admitting: Family Medicine

## 2022-12-27 VITALS — BP 122/82 | HR 91 | Ht 67.0 in | Wt 156.0 lb

## 2022-12-27 DIAGNOSIS — M25551 Pain in right hip: Secondary | ICD-10-CM

## 2022-12-27 NOTE — Patient Instructions (Signed)
3 IBU 3x a day for 3 days Keep doing exercises Can go to daily starting Monday See me in 3 months

## 2022-12-27 NOTE — Assessment & Plan Note (Signed)
Right hip seems to more of a gluteal injury but does have anti-inflammatories.  Discussed 600 mg of ibuprofen 3 times a day for 3 days to see if this would be significantly beneficial.  Patient is already is responding to even lighter doses than that.  Encourage patient to try to increase the frequency of the exercises and the hip abductor strengthening.  Discussed which activities to do and which ones to avoid.  Follow-up with me again in 6 to 8 weeks otherwise.

## 2023-01-11 ENCOUNTER — Encounter: Payer: Self-pay | Admitting: Family Medicine

## 2023-01-15 ENCOUNTER — Other Ambulatory Visit: Payer: Self-pay | Admitting: Family Medicine

## 2023-01-15 DIAGNOSIS — M25551 Pain in right hip: Secondary | ICD-10-CM

## 2023-01-15 DIAGNOSIS — M545 Low back pain, unspecified: Secondary | ICD-10-CM

## 2023-01-15 NOTE — Therapy (Unsigned)
OUTPATIENT PHYSICAL THERAPY THORACOLUMBAR EVALUATION   Patient Name: Taylor Anderson MRN: [id number] DOB:Nov 01, 1950, 72 y.o., female Today's Date: 01/16/2023  END OF SESSION:  PT End of Session - 01/16/23 1022     Visit Number 1    Date for PT Re-Evaluation 03/27/23    PT Start Time 0930    PT Stop Time 1010    PT Time Calculation (min) 40 min    Activity Tolerance Patient tolerated treatment well    Behavior During Therapy [facility] for tasks assessed/performed             Past Medical History:  Diagnosis Date   Back pain    Enlarged thyroid 2012   2 nodules, biopsy showing goiter   Migraines    between 1980-1983   Osteopenia    Pleurisy 1986   Pneumonia    hx of   Shingles    Past Surgical History:  Procedure Laterality Date   COLONOSCOPY W/ POLYPECTOMY  2002   DILATION AND CURETTAGE OF UTERUS  1993   TONSILLECTOMY AND ADENOIDECTOMY  1959   TUBAL LIGATION  1993   Patient Active Problem List   Diagnosis Date Noted   Right hip pain 11/09/2022   Osteopenia 09/01/2022   Dermatochalasis of both upper eyelids 12/28/2017   Adie's tonic pupil, 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   Astigmatism with presbyopia, bilateral 12/25/2016   Family history of glaucoma 12/25/2016   Nuclear sclerotic cataract of both eyes 12/25/2016   Posterior vitreous detachment of both eyes 12/25/2016   Chronic cough 01/03/2011    PCP: Irena Reichmann, DO  REFERRING PROVIDER: Judi Saa, DO   REFERRING DIAG:  [phone] (ICD-10-CM) - Right hip pain  M54.50 (ICD-10-CM) - Lumbar spine pain    Rationale for Evaluation and Treatment: Rehabilitation  THERAPY DIAG:  Difficulty in walking, not elsewhere classified  Muscle weakness (generalized)  Pain in right hip  ONSET DATE: 12/27/22  SUBJECTIVE:                                                                                                                                                                                           SUBJECTIVE  STATEMENT: Patient reports new deep post hip pain in Jan. It would wake her up at night and she has great difficulty managing steps. She otherwise is able to perform normal activities. She received an injection and some exercises in April, but has not had any relief. She has recently noticed R knee pain as well. Patient's son is a PT and did give her some strengthening exercises, but deferred full assessment and treat.  PERTINENT HISTORY:  Per referring provider note Back  exam shows does have some loss of lordosis noted.  Some tenderness to palpation in the paraspinal musculature but very minimal.   More on the gluteal area than anywhere else.  Tightness with FABER test.  Negative straight leg test  Received injection for similar pain on 11/08/22 along with HEP. PAIN:  Are you having pain? Yes: NPRS scale: 9/10 Pain location: R glut pain Pain description: aching Aggravating factors: Stair climbing, sitting for a period of time. Relieving factors: Advil,  PRECAUTIONS: None  WEIGHT BEARING RESTRICTIONS: No  FALLS:  Has patient fallen in last 6 months? No  LIVING ENVIRONMENT: Lives with: lives with their family Lives in: House/apartment Stairs: Yes: Internal: 14 steps; on left going up and External: 2 steps; bilateral but cannot reach both Has following equipment at home: None  OCCUPATION: Retired, very limited walking- slower and shorter strides  PLOF: Independent  PATIENT GOALS: Return to her normal daily activities and normal walking routine without pain.  NEXT MD VISIT: about 2 months  OBJECTIVE:   DIAGNOSTIC FINDINGS:  11/11/22 IMPRESSION: 1. Mild-to-moderate multilevel degenerative disc and endplate changes, worst at L4-5. 2. Grade 1 anterolisthesis of L4 on L5.  PATIENT SURVEYS:  FOTO 51.9  SCREENING FOR RED FLAGS: Bowel or bladder incontinence: No Spinal tumors: No Cauda equina syndrome: No Compression fracture: No Abdominal aneurysm:  No  COGNITION: Overall cognitive status: Within functional limits for tasks assessed     SENSATION: Chronic R toe numbness, no changes  MUSCLE LENGTH: Hamstrings: Right 65 deg; Left 70 deg Thomas test: WFL B  POSTURE: rounded shoulders, decreased lumbar lordosis, and decreased thoracic kyphosis  PALPATION: TTP over B piriformis, R > L  LUMBAR ROM: WFL, noted some tightness in flex and B rotation,but no pain.  LOWER EXTREMITY ROM:   Limited in R hip flex, ER, IR, otherwise WFL B   LOWER EXTREMITY MMT:  B hips 4-/5 in ext, 4/5 Abd, otherwise 5/5   LUMBAR SPECIAL TESTS:  Straight leg raise test: Negative, Slump test: Negative, Single leg stance test: Positive, and FABER test: Positive  GAIT: Distance walked: In clinic distances Assistive device utilized: None Level of assistance: Complete Independence Comments: Antalgic gait on R, mild, increase lateral hip excursion.  TODAY'S TREATMENT:                                                                                                                              DATE:  01/16/23 Education    PATIENT EDUCATION:  Education details: POC Person educated: Patient Education method: Explanation Education comprehension: verbalized understanding  HOME EXERCISE PROGRAM: Has some exercises including figure 4, glut stretch, SLR with strap, bridge, clamshells, and a few others.  ASSESSMENT:  CLINICAL IMPRESSION: Patient is a 72 y.o. who was seen today for physical therapy evaluation and treatment for R hip pain. She reports onset about in January. She received injection in April, but her pain remained elevated and now impedes in all activity, particularly  stair climbing, limits her walking program. She is TTP directly over B piriformis muscles, R > L. She shows some weakness in B hips and limited ROM in R hip due to pain and tightness. She also reports fairly new onset of R knee pain, and shows ITB tightness and pain near lat knee.  She will benefit from PT to address her spasms in piriformis as well as promote strengthening in hips and functional re-education to be able to return to her previous activity level without pain or fear of falling.  OBJECTIVE IMPAIRMENTS: Abnormal gait, decreased activity tolerance, decreased mobility, difficulty walking, decreased ROM, decreased strength, impaired flexibility, postural dysfunction, and pain.   ACTIVITY LIMITATIONS: lifting, bending, squatting, and locomotion level  PARTICIPATION LIMITATIONS: meal prep, cleaning, laundry, shopping, and community activity  PERSONAL FACTORS: Past/current experiences are also affecting patient's functional outcome.   REHAB POTENTIAL: Good  CLINICAL DECISION MAKING: Stable/uncomplicated  EVALUATION COMPLEXITY: Low   GOALS: Goals reviewed with patient? Yes  SHORT TERM GOALS: Target date: 02/02/23  I with initial HEP Baseline: Goal status: INITIAL  LONG TERM GOALS: Target date: 03/27/23  I with final HEP Baseline:  Goal status: INITIAL  2.  Patient will improve her FOTO score to at least 63 Baseline: 51.9 Goal status: INITIAL  3.  Patient will be able to climb a flight of steps, up and down, with U handrail, MI, no pain in R hip Baseline: Modified climbing due to pain in R hip Goal status: INITIAL  4.  Patient will be able to return to her normal walking routine with no pain in R hip, normalized gait pattern Baseline: Reports severely restricted distances due to pain. Shortened step length. Goal status: INITIAL  5.  Patient will be able to get down to the floor and back up in order to return to her normal gardening activities Baseline: Modified Goal status: INITIAL  PLAN:  PT FREQUENCY: 1-2x/week  PT DURATION: 10 weeks  PLANNED INTERVENTIONS: Therapeutic exercises, Therapeutic activity, Neuromuscular re-education, Balance training, Gait training, Patient/Family education, Self Care, Joint mobilization, Dry Needling,  Electrical stimulation, Cryotherapy, Moist heat, Ionotophoresis 4mg /ml Dexamethasone, and Manual therapy.  PLAN FOR NEXT SESSION: Deep tissue mobs to piriformis, R > L, update HEP for stretch and especially strengthening for hips.   Iona Beard, DPT 01/16/2023, 10:30 AM

## 2023-01-16 ENCOUNTER — Encounter: Payer: Self-pay | Admitting: Physical Therapy

## 2023-01-16 ENCOUNTER — Ambulatory Visit: Payer: Medicare HMO | Attending: Family Medicine | Admitting: Physical Therapy

## 2023-01-16 DIAGNOSIS — M6281 Muscle weakness (generalized): Secondary | ICD-10-CM | POA: Diagnosis present

## 2023-01-16 DIAGNOSIS — M25551 Pain in right hip: Secondary | ICD-10-CM | POA: Diagnosis present

## 2023-01-16 DIAGNOSIS — R262 Difficulty in walking, not elsewhere classified: Secondary | ICD-10-CM | POA: Diagnosis present

## 2023-01-16 DIAGNOSIS — M545 Low back pain, unspecified: Secondary | ICD-10-CM | POA: Diagnosis not present

## 2023-01-19 ENCOUNTER — Ambulatory Visit: Payer: Medicare HMO | Admitting: Physical Therapy

## 2023-01-19 DIAGNOSIS — M25551 Pain in right hip: Secondary | ICD-10-CM

## 2023-01-19 DIAGNOSIS — M6281 Muscle weakness (generalized): Secondary | ICD-10-CM

## 2023-01-19 DIAGNOSIS — R262 Difficulty in walking, not elsewhere classified: Secondary | ICD-10-CM | POA: Diagnosis not present

## 2023-01-19 NOTE — Therapy (Signed)
OUTPATIENT PHYSICAL THERAPY THORACOLUMBAR    Patient Name: Taylor Anderson MRN: [id number] DOB:29-Apr-1951, 72 y.o., female Today's Date: 01/19/2023  END OF SESSION:  PT End of Session - 01/19/23 0920     Visit Number 2    Date for PT Re-Evaluation 03/27/23    PT Start Time 0930    PT Stop Time 1015    PT Time Calculation (min) 45 min             Past Medical History:  Diagnosis Date   Back pain    Enlarged thyroid 2012   2 nodules, biopsy showing goiter   Migraines    between 1980-1983   Osteopenia    Pleurisy 1986   Pneumonia    hx of   Shingles    Past Surgical History:  Procedure Laterality Date   COLONOSCOPY W/ POLYPECTOMY  2002   DILATION AND CURETTAGE OF UTERUS  1993   TONSILLECTOMY AND ADENOIDECTOMY  1959   TUBAL LIGATION  1993   Patient Active Problem List   Diagnosis Date Noted   Right hip pain 11/09/2022   Osteopenia 09/01/2022   Dermatochalasis of both upper eyelids 12/28/2017   Adie's tonic pupil, 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   Astigmatism with presbyopia, bilateral 12/25/2016   Family history of glaucoma 12/25/2016   Nuclear sclerotic cataract of both eyes 12/25/2016   Posterior vitreous detachment of both eyes 12/25/2016   Chronic cough 01/03/2011    PCP: Irena Reichmann, DO  REFERRING PROVIDER: Judi Saa, DO   REFERRING DIAG:  [phone] (ICD-10-CM) - Right hip pain  M54.50 (ICD-10-CM) - Lumbar spine pain    Rationale for Evaluation and Treatment: Rehabilitation  THERAPY DIAG:  Difficulty in walking, not elsewhere classified  Muscle weakness (generalized)  Pain in right hip  ONSET DATE: 12/27/22  SUBJECTIVE:                                                                                                                                                                                           SUBJECTIVE STATEMENT: Okay unless I sit to long or do steps  PERTINENT HISTORY:  Per referring provider note Back exam shows does have some loss of  lordosis noted.  Some tenderness to palpation in the paraspinal musculature but very minimal.   More on the gluteal area than anywhere else.  Tightness with FABER test.  Negative straight leg test  Received injection for similar pain on 11/08/22 along with HEP. PAIN:  Are you having pain? Yes: NPRS scale: 9/10 Pain location: R glut pain Pain description: aching Aggravating factors: Stair climbing, sitting for a period of time. Relieving factors: Advil,  PRECAUTIONS: None  WEIGHT BEARING RESTRICTIONS: No  FALLS:  Has patient fallen in last 6 months? No  LIVING ENVIRONMENT: Lives with: lives with their family Lives in: House/apartment Stairs: Yes: Internal: 14 steps; on left going up and External: 2 steps; bilateral but cannot reach both Has following equipment at home: None  OCCUPATION: Retired, very limited walking- slower and shorter strides  PLOF: Independent  PATIENT GOALS: Return to her normal daily activities and normal walking routine without pain.  NEXT MD VISIT: about 2 months  OBJECTIVE:   DIAGNOSTIC FINDINGS:  11/11/22 IMPRESSION: 1. Mild-to-moderate multilevel degenerative disc and endplate changes, worst at L4-5. 2. Grade 1 anterolisthesis of L4 on L5.  PATIENT SURVEYS:  FOTO 51.9  SCREENING FOR RED FLAGS: Bowel or bladder incontinence: No Spinal tumors: No Cauda equina syndrome: No Compression fracture: No Abdominal aneurysm: No  COGNITION: Overall cognitive status: Within functional limits for tasks assessed     SENSATION: Chronic R toe numbness, no changes  MUSCLE LENGTH: Hamstrings: Right 65 deg; Left 70 deg Thomas test: WFL B  POSTURE: rounded shoulders, decreased lumbar lordosis, and decreased thoracic kyphosis  PALPATION: TTP over B piriformis, R > L  LUMBAR ROM: WFL, noted some tightness in flex and B rotation,but no pain.  LOWER EXTREMITY ROM:   Limited in R hip flex, ER, IR, otherwise WFL B   LOWER EXTREMITY MMT:  B hips 4-/5  in ext, 4/5 Abd, otherwise 5/5   LUMBAR SPECIAL TESTS:  Straight leg raise test: Negative, Slump test: Negative, Single leg stance test: Positive, and FABER test: Positive  GAIT: Distance walked: In clinic distances Assistive device utilized: None Level of assistance: Complete Independence Comments: Antalgic gait on R, mild, increase lateral hip excursion.  TODAY'S TREATMENT:                                                                                                                              DATE:   01/19/23 Nustep L 5 5 min Resisted gait 4 way 30 # each Leg Press 30# 3 sets 10 feet 3 way. Calf raises 30# 2 sets 10 Feet on ball bridge, KTC and obliques Tband hip flex and clams 2 sets 10 Tband SLR with abd 10 x PROM RT LE- minimal tightness HS ,IT and piriformis Prone hip IR/ER pain and tightness DSTW to RT piriformis with and without rotation Ionto #1 to RT piriformis       01/16/23 Education    PATIENT EDUCATION:  Education details: POC Person educated: Patient Education method: Explanation Education comprehension: verbalized understanding  HOME EXERCISE PROGRAM: Has some exercises including figure 4, glut stretch, SLR with strap, bridge, clamshells, and a few others.  ASSESSMENT:  CLINICAL IMPRESSION: Pt is compliant with HEP. Hip weakness noted on RT with ex but tolerated well. 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 Trial of into and educ on DN and benefits for next  session. STG met  OBJECTIVE IMPAIRMENTS: Abnormal gait, decreased activity tolerance, decreased mobility, difficulty walking, decreased ROM, decreased strength, impaired flexibility, postural dysfunction, and pain.   ACTIVITY LIMITATIONS: lifting, bending, squatting, and locomotion level  PARTICIPATION LIMITATIONS: meal prep, cleaning, laundry, shopping, and community activity  PERSONAL FACTORS: Past/current experiences are also  affecting patient's functional outcome.   REHAB POTENTIAL: Good  CLINICAL DECISION MAKING: Stable/uncomplicated  EVALUATION COMPLEXITY: Low   GOALS: Goals reviewed with patient? Yes  SHORT TERM GOALS: Target date: 02/02/23  I with initial HEP Baseline: Goal status: 01/19/23 MET  LONG TERM GOALS: Target date: 03/27/23  I with final HEP Baseline:  Goal status: INITIAL  2.  Patient will improve her FOTO score to at least 63 Baseline: 51.9 Goal status: INITIAL  3.  Patient will be able to climb a flight of steps, up and down, with U handrail, MI, no pain in R hip Baseline: Modified climbing due to pain in R hip Goal status: INITIAL  4.  Patient will be able to return to her normal walking routine with no pain in R hip, normalized gait pattern Baseline: Reports severely restricted distances due to pain. Shortened step length. Goal status: INITIAL  5.  Patient will be able to get down to the floor and back up in order to return to her normal gardening activities Baseline: Modified Goal status: INITIAL  PLAN:  PT FREQUENCY: 1-2x/week  PT DURATION: 10 weeks  PLANNED INTERVENTIONS: Therapeutic exercises, Therapeutic activity, Neuromuscular re-education, Balance training, Gait training, Patient/Family education, Self Care, Joint mobilization, Dry Needling, Electrical stimulation, Cryotherapy, Moist heat, Ionotophoresis 4mg /ml Dexamethasone, and Manual therapy.  PLAN FOR NEXT SESSION: Deep tissue mobs to piriformis, R > L. Dry Needle and ionto. Hip strength   Marylene Land Zakeya Junker PTA 01/19/2023, 9:21 AM [facility] [address] [facility] [place], [place], [address] Phone: [phone]   Fax:  [phone]  Patient Details  Name: Nanea Craige MRN: [id number] Date of Birth: Nov 18, 1950 Referring Provider:  Judi Saa, DO  Encounter Date: 01/19/2023   Suanne Marker, PTA 01/19/2023, 9:21 AM  [place]  [facility] at [facility] [address] [facility] [place], [place], [address] Phone: [phone]   Fax:  [phone]

## 2023-01-22 ENCOUNTER — Ambulatory Visit: Payer: Medicare HMO | Attending: Family Medicine

## 2023-01-22 DIAGNOSIS — M25551 Pain in right hip: Secondary | ICD-10-CM

## 2023-01-22 DIAGNOSIS — M6281 Muscle weakness (generalized): Secondary | ICD-10-CM | POA: Diagnosis present

## 2023-01-22 DIAGNOSIS — R262 Difficulty in walking, not elsewhere classified: Secondary | ICD-10-CM | POA: Diagnosis present

## 2023-01-22 NOTE — Therapy (Signed)
OUTPATIENT PHYSICAL THERAPY THORACOLUMBAR    Patient Name: Harlowe Mikita MRN: [id number] DOB:[birth year], 72 y.o., female Today's Date: 01/22/2023  END OF SESSION:  PT End of Session - 01/22/23 1343     Visit Number 3    Date for PT Re-Evaluation 03/27/23    PT Start Time 1343    PT Stop Time 1430    PT Time Calculation (min) 47 min    Activity Tolerance Patient tolerated treatment well    Behavior During Therapy [facility] for tasks assessed/performed             Past Medical History:  Diagnosis Date   Back pain    Enlarged thyroid 2012   2 nodules, biopsy showing goiter   Migraines    between 1980-1983   Osteopenia    Pleurisy 1986   Pneumonia    hx of   Shingles    Past Surgical History:  Procedure Laterality Date   COLONOSCOPY W/ POLYPECTOMY  2002   DILATION AND CURETTAGE OF UTERUS  1993   TONSILLECTOMY AND ADENOIDECTOMY  1959   TUBAL LIGATION  1993   Patient Active Problem List   Diagnosis Date Noted   Right hip pain 11/09/2022   Osteopenia 09/01/2022   Dermatochalasis of both upper eyelids 12/28/2017   Adie's tonic pupil, 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  ONSET DATE: 12/27/22  SUBJECTIVE:                                                                                                                                                                                           SUBJECTIVE STATEMENT: Okay unless I sit to long or do steps  PERTINENT HISTORY:  Per referring provider note Back  exam shows does have some loss of lordosis noted.  Some tenderness to palpation in the paraspinal musculature but very minimal.   More on the gluteal area than anywhere else.  Tightness with FABER test.  Negative straight leg test  Received injection for similar pain on 11/08/22 along with HEP. PAIN:  Are you having pain? Yes: NPRS scale: 9/10 Pain location: R glut pain Pain description: aching Aggravating factors: Stair climbing, sitting for  a period of time. Relieving factors: Advil,  PRECAUTIONS: None  WEIGHT BEARING RESTRICTIONS: No  FALLS:  Has patient fallen in last 6 months? No  LIVING ENVIRONMENT: Lives with: lives with their family Lives in: House/apartment Stairs: Yes: Internal: 14 steps; on left going up and External: 2 steps; bilateral but cannot reach both Has following equipment at home: None  OCCUPATION: Retired, very limited walking- slower and shorter strides  PLOF: Independent  PATIENT GOALS: Return to her normal daily activities and normal walking routine without pain.  NEXT MD VISIT: about 2 months  OBJECTIVE:   DIAGNOSTIC FINDINGS:  11/11/22 IMPRESSION: 1. Mild-to-moderate multilevel degenerative disc and endplate changes, worst at L4-5. 2. Grade 1 anterolisthesis of L4 on L5.  PATIENT SURVEYS:  FOTO 51.9  SCREENING FOR RED FLAGS: Bowel or bladder incontinence: No Spinal tumors: No Cauda equina syndrome: No Compression fracture: No Abdominal aneurysm: No  COGNITION: Overall cognitive status: Within functional limits for tasks assessed     SENSATION: Chronic R toe numbness, no changes  MUSCLE LENGTH: Hamstrings: Right 65 deg; Left 70 deg Thomas test: WFL B  POSTURE: rounded shoulders, decreased lumbar lordosis, and decreased thoracic kyphosis  PALPATION: TTP over B piriformis, R > L  LUMBAR ROM: WFL, noted some tightness in flex and B rotation,but no pain.  LOWER EXTREMITY ROM:   Limited in R hip flex, ER, IR, otherwise WFL  B   LOWER EXTREMITY MMT:  B hips 4-/5 in ext, 4/5 Abd, otherwise 5/5   LUMBAR SPECIAL TESTS:  Straight leg raise test: Negative, Slump test: Negative, Single leg stance test: Positive, and FABER test: Positive  GAIT: Distance walked: In clinic distances Assistive device utilized: None Level of assistance: Complete Independence Comments: Antalgic gait on R, mild, increase lateral hip excursion.  TODAY'S TREATMENT:                                                                                                                              DATE:  01/22/23:   Manual:  Trigger Point Dry-Needling  Treatment instructions: Expect mild to moderate muscle soreness. S/S of pneumothorax if dry needled over a lung field, and to seek immediate medical attention should they occur. Patient verbalized understanding of these instructions and education. Patient Consent Given: Yes Education handout provided: Yes Muscles treated: R gluteus medius, R gluteus minimus, R TFL, R piriformis Electrical stimulation performed: No Parameters: N/A Treatment response/outcome: Twitch Response Elicited and Palpable Increase in Muscle Length   Deep pressure, cross fricotion massage R gluteals, piriformis Therex: Nustep LE's and Ue's, 6 min, level 5 Instructed in therex as described below, to begin strengthening ex 01/19/23 Nustep L 5 5 min Resisted gait 4 way 30 # each Leg Press 30# 3 sets 10 feet 3 way. Calf raises 30# 2 sets 10 Feet on ball bridge, KTC and obliques Tband hip flex and clams 2 sets 10 Tband SLR with abd 10 x PROM RT LE- minimal tightness HS ,  IT and piriformis Prone hip IR/ER pain and tightness DSTW to RT piriformis with and without rotation Ionto #1 to RT piriformis       01/16/23 Education    PATIENT EDUCATION:  Education details: POC Person educated: Patient Education method: Explanation Education comprehension: verbalized understanding  HOME EXERCISE PROGRAM: Has some  exercises including figure 4, glut stretch, SLR with strap, bridge, clamshells, and a few others. Access Code: [email or web address] URL: [email or web address] Date: 01/22/2023 Prepared by: Caralee Ates  Exercises - Figure 4 Bridge  - 1 x daily - 3 x weekly - 3 sets - 10 reps - Sidelying Hip Abduction  - 1 x daily - 3 x weekly - 3 sets - 10 reps - Seated Hip Abduction with Resistance  - 1 x daily - 7 x weekly - 3 sets - 10 reps ASSESSMENT:  CLINICAL IMPRESSION: Pt is compliant with HEP. Hip weakness noted on R especially for ER and extension. Modified her home program and advanced strengthening today.  Advised to perform 3 x weekly to avoid increased tissue irritability R hip.  She responded well to today;s intervention.   OBJECTIVE IMPAIRMENTS: Abnormal gait, decreased activity tolerance, decreased mobility, difficulty walking, decreased ROM, decreased strength, impaired flexibility, postural dysfunction, and pain.   ACTIVITY LIMITATIONS: lifting, bending, squatting, and locomotion level  PARTICIPATION LIMITATIONS: meal prep, cleaning, laundry, shopping, and community activity  PERSONAL FACTORS: Past/current experiences are also affecting patient's functional outcome.   REHAB POTENTIAL: Good  CLINICAL DECISION MAKING: Stable/uncomplicated  EVALUATION COMPLEXITY: Low   GOALS: Goals reviewed with patient? Yes  SHORT TERM GOALS: Target date: 02/02/23  I with initial HEP Baseline: Goal status: 01/19/23 MET  LONG TERM GOALS: Target date: 03/27/23  I with final HEP Baseline:  Goal status: INITIAL  2.  Patient will improve her FOTO score to at least 63 Baseline: 51.9 Goal status: INITIAL  3.  Patient will be able to climb a flight of steps, up and down, with U handrail, MI, no pain in R hip Baseline: Modified climbing due to pain in R hip Goal status: INITIAL  4.  Patient will be able to return to her normal walking routine with no pain in R hip, normalized gait  pattern Baseline: Reports severely restricted distances due to pain. Shortened step length. Goal status: INITIAL  5.  Patient will be able to get down to the floor and back up in order to return to her normal gardening activities Baseline: Modified Goal status: INITIAL  PLAN:  PT FREQUENCY: 1-2x/week  PT DURATION: 10 weeks  PLANNED INTERVENTIONS: Therapeutic exercises, Therapeutic activity, Neuromuscular re-education, Balance training, Gait training, Patient/Family education, Self Care, Joint mobilization, Dry Needling, Electrical stimulation, Cryotherapy, Moist heat, Ionotophoresis 4mg /ml Dexamethasone, and Manual therapy.  PLAN FOR NEXT SESSION: Deep tissue mobs to piriformis, R > L. Dry Needle and ionto. Hip strengthening   Treston Coker PT, DPT Board -Certified Clinical Specialist in Orthopaedic Physical Therapy 01/22/2023, 5:40 PM [place] [facility] at [facility] [address] [facility] [place], [place], [address] Phone: [phone]   Fax:  [phone]  Patient Details  Name: Sincere Castleberry MRN: [id number] Date of Birth: 1951/04/09 Referring Provider:  Judi Saa, DO  Encounter Date: 01/22/2023   Early Chars, PT, DPT Board-Certified Specialist in Orthopaedic Physical Therapy 01/22/2023, 5:40 PM  [place] [facility] at [facility] [address] [facility] [place], [place], [address] Phone: [phone]   Fax:  [phone]

## 2023-01-24 ENCOUNTER — Encounter: Payer: Self-pay | Admitting: Physical Therapy

## 2023-01-24 ENCOUNTER — Ambulatory Visit: Payer: Medicare HMO | Admitting: Physical Therapy

## 2023-01-24 DIAGNOSIS — M6281 Muscle weakness (generalized): Secondary | ICD-10-CM

## 2023-01-24 DIAGNOSIS — M25551 Pain in right hip: Secondary | ICD-10-CM

## 2023-01-24 DIAGNOSIS — R262 Difficulty in walking, not elsewhere classified: Secondary | ICD-10-CM

## 2023-01-24 NOTE — Therapy (Signed)
OUTPATIENT PHYSICAL THERAPY THORACOLUMBAR TREATMENT   Patient Name: Taylor Anderson MRN: [id number] DOB:1950/08/05, 72 y.o., female Today's Date: 01/24/2023  END OF SESSION:  PT End of Session - 01/24/23 0918     Visit Number 4    Date for PT Re-Evaluation 03/27/23    PT Start Time 0847    PT Stop Time 0926    PT Time Calculation (min) 39 min    Activity Tolerance Patient tolerated treatment well    Behavior During Therapy [facility] for tasks assessed/performed              Past Medical History:  Diagnosis Date   Back pain    Enlarged thyroid 2012   2 nodules, biopsy showing goiter   Migraines    between 1980-1983   Osteopenia    Pleurisy 1986   Pneumonia    hx of   Shingles    Past Surgical History:  Procedure Laterality Date   COLONOSCOPY W/ POLYPECTOMY  2002   DILATION AND CURETTAGE OF UTERUS  1993   TONSILLECTOMY AND ADENOIDECTOMY  1959   TUBAL LIGATION  1993   Patient Active Problem List   Diagnosis Date Noted   Right hip pain 11/09/2022   Osteopenia 09/01/2022   Dermatochalasis of both upper eyelids 12/28/2017   Adie's tonic pupil, 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   Astigmatism with presbyopia, bilateral 12/25/2016   Family history of glaucoma 12/25/2016   Nuclear sclerotic cataract of both eyes 12/25/2016   Posterior vitreous detachment of both eyes 12/25/2016   Chronic cough 01/03/2011    PCP: Irena Reichmann, DO  REFERRING PROVIDER: Judi Saa, DO   REFERRING DIAG:  [phone] (ICD-10-CM) - Right hip pain  M54.50 (ICD-10-CM) - Lumbar spine pain    Rationale for Evaluation and Treatment: Rehabilitation  THERAPY DIAG:  Difficulty in walking, not elsewhere classified  Muscle weakness (generalized)  Pain in right hip  ONSET DATE: 12/27/22  SUBJECTIVE:                                                                                                                                                                                           SUBJECTIVE  STATEMENT:  I'm feeling good, I came in on Monday and I had dry needling and it helped. Last night I was able to go up the stairs without the rail, but its still sore when I go up the steps. I had less pain with transitioning to standing from sitting last night.   PERTINENT HISTORY:  Per referring provider note Back exam shows does have some loss of lordosis noted.  Some tenderness to palpation in the paraspinal musculature but  very minimal.   More on the gluteal area than anywhere else.  Tightness with FABER test.  Negative straight leg test  Received injection for similar pain on 11/08/22 along with HEP. PAIN:  Are you having pain? Yes: NPRS scale: 1/10 Pain location: R glut pain, can work its way down to my knee  Pain description: aching Aggravating factors: Stair climbing, sitting for a period of time. Relieving factors: Advil,  dry needling   PRECAUTIONS: None  WEIGHT BEARING RESTRICTIONS: No  FALLS:  Has patient fallen in last 6 months? No  LIVING ENVIRONMENT: Lives with: lives with their family Lives in: House/apartment Stairs: Yes: Internal: 14 steps; on left going up and External: 2 steps; bilateral but cannot reach both Has following equipment at home: None  OCCUPATION: Retired, very limited walking- slower and shorter strides  PLOF: Independent  PATIENT GOALS: Return to her normal daily activities and normal walking routine without pain.  NEXT MD VISIT: about 2 months  OBJECTIVE:   DIAGNOSTIC FINDINGS:  11/11/22 IMPRESSION: 1. Mild-to-moderate multilevel degenerative disc and endplate changes, worst at L4-5. 2. Grade 1 anterolisthesis of L4 on L5.  PATIENT SURVEYS:  FOTO 51.9  SCREENING FOR RED FLAGS: Bowel or bladder incontinence: No Spinal tumors: No Cauda equina syndrome: No Compression fracture: No Abdominal aneurysm: No  COGNITION: Overall cognitive status: Within functional limits for tasks assessed     SENSATION: Chronic R toe numbness, no  changes  MUSCLE LENGTH: Hamstrings: Right 65 deg; Left 70 deg Thomas test: WFL B  POSTURE: rounded shoulders, decreased lumbar lordosis, and decreased thoracic kyphosis  PALPATION: TTP over B piriformis, R > L  LUMBAR ROM: WFL, noted some tightness in flex and B rotation,but no pain.  LOWER EXTREMITY ROM:   Limited in R hip flex, ER, IR, otherwise WFL B   LOWER EXTREMITY MMT:  B hips 4-/5 in ext, 4/5 Abd, otherwise 5/5   LUMBAR SPECIAL TESTS:  Straight leg raise test: Negative, Slump test: Negative, Single leg stance test: Positive, and FABER test: Positive  GAIT: Distance walked: In clinic distances Assistive device utilized: None Level of assistance: Complete Independence Comments: Antalgic gait on R, mild, increase lateral hip excursion.  TODAY'S TREATMENT:                                                                                                                              DATE:     01/24/23  TherEx  Nustep L4x6 minutes BLEs  Bridge + ABD into red TB x12 Sidelying hip ABD 0# x12 B Walking bridges x6 Brief round of deep MFR to R piriformis     Selfcare  Discussed frequency of prior exercise programming from past PT and MDs in addition to resistance exercises given here, encouraged warmup before exercise such as walking     01/22/23:   Manual:  Trigger Point Dry-Needling  Treatment instructions: Expect mild to moderate muscle soreness. S/S of pneumothorax if dry needled over  a lung field, and to seek immediate medical attention should they occur. Patient verbalized understanding of these instructions and education. Patient Consent Given: Yes Education handout provided: Yes Muscles treated: R gluteus medius, R gluteus minimus, R TFL, R piriformis Electrical stimulation performed: No Parameters: N/A Treatment response/outcome: Twitch Response Elicited and Palpable Increase in Muscle Length   Deep pressure, cross fricotion massage R gluteals,  piriformis Therex: Nustep LE's and Ue's, 6 min, level 5 Instructed in therex as described below, to begin strengthening ex 01/19/23 Nustep L 5 5 min Resisted gait 4 way 30 # each Leg Press 30# 3 sets 10 feet 3 way. Calf raises 30# 2 sets 10 Feet on ball bridge, KTC and obliques Tband hip flex and clams 2 sets 10 Tband SLR with abd 10 x PROM RT LE- minimal tightness HS ,IT and piriformis Prone hip IR/ER pain and tightness DSTW to RT piriformis with and without rotation Ionto #1 to RT piriformis       01/16/23 Education    PATIENT EDUCATION:  Education details: POC Person educated: Patient Education method: Explanation Education comprehension: verbalized understanding  HOME EXERCISE PROGRAM: Has some exercises including figure 4, glut stretch, SLR with strap, bridge, clamshells, and a few others. Access Code: [email or web address] URL: [email or web address] Date: 01/22/2023 Prepared by: Caralee Ates  Exercises - Figure 4 Bridge  - 1 x daily - 3 x weekly - 3 sets - 10 reps - Sidelying Hip Abduction  - 1 x daily - 3 x weekly - 3 sets - 10 reps - Seated Hip Abduction with Resistance  - 1 x daily - 7 x weekly - 3 sets - 10 reps ASSESSMENT:  CLINICAL IMPRESSION:  Khania arrives today doing well, feels dry needling really helped her- we will plan on repeating this when she is working with a dry needling certified PT. Continued focus on functional strengthening and flexibility today with progressions as tolerated, she seems to be responding well to PT overall.    IMPAIRMENTS: Abnormal gait, decreased activity tolerance, decreased mobility, difficulty walking, decreased ROM, decreased strength, impaired flexibility, postural dysfunction, and pain.   ACTIVITY LIMITATIONS: lifting, bending, squatting, and locomotion level  PARTICIPATION LIMITATIONS: meal prep, cleaning, laundry, shopping, and community activity  PERSONAL FACTORS: Past/current experiences are also affecting  patient's functional outcome.   REHAB POTENTIAL: Good  CLINICAL DECISION MAKING: Stable/uncomplicated  EVALUATION COMPLEXITY: Low   GOALS: Goals reviewed with patient? Yes  SHORT TERM GOALS: Target date: 02/02/23  I with initial HEP Baseline: Goal status: 01/19/23 MET  LONG TERM GOALS: Target date: 03/27/23  I with final HEP Baseline:  Goal status: INITIAL  2.  Patient will improve her FOTO score to at least 63 Baseline: 51.9 Goal status: INITIAL  3.  Patient will be able to climb a flight of steps, up and down, with U handrail, MI, no pain in R hip Baseline: Modified climbing due to pain in R hip Goal status: INITIAL  4.  Patient will be able to return to her normal walking routine with no pain in R hip, normalized gait pattern Baseline: Reports severely restricted distances due to pain. Shortened step length. Goal status: INITIAL  5.  Patient will be able to get down to the floor and back up in order to return to her normal gardening activities Baseline: Modified Goal status: INITIAL  PLAN:  PT FREQUENCY: 1-2x/week  PT DURATION: 10 weeks  PLANNED INTERVENTIONS: Therapeutic exercises, Therapeutic activity, Neuromuscular re-education, Balance training, Gait training, Patient/Family education, Self Care, Joint  mobilization, Dry Needling, Electrical stimulation, Cryotherapy, Moist heat, Ionotophoresis 4mg /ml Dexamethasone, and Manual therapy.  PLAN FOR NEXT SESSION: Deep tissue mobs to piriformis, R > L. Continue dry needling Hip strengthening   Nedra Hai, PT, DPT 01/24/23 9:26 AM

## 2023-01-30 ENCOUNTER — Ambulatory Visit: Payer: Medicare HMO | Admitting: Physical Therapy

## 2023-01-30 DIAGNOSIS — R262 Difficulty in walking, not elsewhere classified: Secondary | ICD-10-CM | POA: Diagnosis not present

## 2023-01-30 DIAGNOSIS — M25551 Pain in right hip: Secondary | ICD-10-CM

## 2023-01-30 DIAGNOSIS — M6281 Muscle weakness (generalized): Secondary | ICD-10-CM

## 2023-01-30 NOTE — Therapy (Signed)
OUTPATIENT PHYSICAL THERAPY THORACOLUMBAR TREATMENT   Patient Name: Taylor Anderson MRN: [id number] DOB:[birth year], 72 y.o., female Today's Date: 01/30/2023  END OF SESSION:  PT End of Session - 01/30/23 0839     Visit Number 5    Date for PT Re-Evaluation 03/27/23    PT Start Time 0837    PT Stop Time 0920    PT Time Calculation (min) 43 min              Past Medical History:  Diagnosis Date   Back pain    Enlarged thyroid 2012   2 nodules, biopsy showing goiter   Migraines    between 1980-1983   Osteopenia    Pleurisy 1986   Pneumonia    hx of   Shingles    Past Surgical History:  Procedure Laterality Date   COLONOSCOPY W/ POLYPECTOMY  2002   DILATION AND CURETTAGE OF UTERUS  1993   TONSILLECTOMY AND ADENOIDECTOMY  1959   TUBAL LIGATION  1993   Patient Active Problem List   Diagnosis Date Noted   Right hip pain 11/09/2022   Osteopenia 09/01/2022   Dermatochalasis of both upper eyelids 12/28/2017   Adie's tonic pupil, 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   Astigmatism with presbyopia, bilateral 12/25/2016   Family history of glaucoma 12/25/2016   Nuclear sclerotic cataract of both eyes 12/25/2016   Posterior vitreous detachment of both eyes 12/25/2016   Chronic cough 01/03/2011    PCP: Irena Reichmann, DO  REFERRING PROVIDER: Judi Saa, DO   REFERRING DIAG:  [phone] (ICD-10-CM) - Right hip pain  M54.50 (ICD-10-CM) - Lumbar spine pain    Rationale for Evaluation and Treatment: Rehabilitation  THERAPY DIAG:  Difficulty in walking, not elsewhere classified  Muscle weakness (generalized)  Pain in right hip  ONSET DATE: 12/27/22  SUBJECTIVE:                                                                                                                                                                                           SUBJECTIVE STATEMENT: definitely getting stronger. Treatment is helping DN.STW and ex  PERTINENT HISTORY:  Per referring provider  note Back exam shows does have some loss of lordosis noted.  Some tenderness to palpation in the paraspinal musculature but very minimal.   More on the gluteal area than anywhere else.  Tightness with FABER test.  Negative straight leg test  Received injection for similar pain on 11/08/22 along with HEP. PAIN:  Are you having pain? Yes: NPRS scale: 1/10 Pain location: R glut pain, can work its way down to my knee  Pain description: aching Aggravating factors: Stair  climbing, sitting for a period of time. Relieving factors: Advil,  dry needling   PRECAUTIONS: None  WEIGHT BEARING RESTRICTIONS: No  FALLS:  Has patient fallen in last 6 months? No  LIVING ENVIRONMENT: Lives with: lives with their family Lives in: House/apartment Stairs: Yes: Internal: 14 steps; on left going up and External: 2 steps; bilateral but cannot reach both Has following equipment at home: None  OCCUPATION: Retired, very limited walking- slower and shorter strides  PLOF: Independent  PATIENT GOALS: Return to her normal daily activities and normal walking routine without pain.  NEXT MD VISIT: about 2 months  OBJECTIVE:   DIAGNOSTIC FINDINGS:  11/11/22 IMPRESSION: 1. Mild-to-moderate multilevel degenerative disc and endplate changes, worst at L4-5. 2. Grade 1 anterolisthesis of L4 on L5.  PATIENT SURVEYS:  FOTO 51.9  SCREENING FOR RED FLAGS: Bowel or bladder incontinence: No Spinal tumors: No Cauda equina syndrome: No Compression fracture: No Abdominal aneurysm: No  COGNITION: Overall cognitive status: Within functional limits for tasks assessed     SENSATION: Chronic R toe numbness, no changes  MUSCLE LENGTH: Hamstrings: Right 65 deg; Left 70 deg Thomas test: WFL B  POSTURE: rounded shoulders, decreased lumbar lordosis, and decreased thoracic kyphosis  PALPATION: TTP over B piriformis, R > L  LUMBAR ROM: WFL, noted some tightness in flex and B rotation,but no pain.  LOWER  EXTREMITY ROM:   Limited in R hip flex, ER, IR, otherwise WFL B   LOWER EXTREMITY MMT:  B hips 4-/5 in ext, 4/5 Abd, otherwise 5/5   LUMBAR SPECIAL TESTS:  Straight leg raise test: Negative, Slump test: Negative, Single leg stance test: Positive, and FABER test: Positive  GAIT: Distance walked: In clinic distances Assistive device utilized: None Level of assistance: Complete Independence Comments: Antalgic gait on R, mild, increase lateral hip excursion.  TODAY'S TREATMENT:                                                                                                                              DATE:   01/30/23 DN RT piriformis by Octavio Graves PT Leg Press feet 3 way 10 x each. Calf raises 2 sets 15 BOSU squats, fwd step ups and lateral step ups 10 x each Green tband hip IR/ER 2 sets 10 STS with green tband at knees holding ABD 15 x TM side stepping 1.2 min each way. BIL pull TM OFF 20 x Dead lifts 5# 10 x Reviewed HEPs that pt brought     01/24/23  TherEx  Nustep L4x6 minutes BLEs  Bridge + ABD into red TB x12 Sidelying hip ABD 0# x12 B Walking bridges x6 Brief round of deep MFR to R piriformis     Selfcare  Discussed frequency of prior exercise programming from past PT and MDs in addition to resistance exercises given here, encouraged warmup before exercise such as walking     01/22/23:   Manual:  Trigger Point Dry-Needling  Treatment instructions: Expect  mild to moderate muscle soreness. S/S of pneumothorax if dry needled over a lung field, and to seek immediate medical attention should they occur. Patient verbalized understanding of these instructions and education. Patient Consent Given: Yes Education handout provided: Yes Muscles treated: R gluteus medius, R gluteus minimus, R TFL, R piriformis Electrical stimulation performed: No Parameters: N/A Treatment response/outcome: Twitch Response Elicited and Palpable Increase in Muscle Length   Deep pressure,  cross fricotion massage R gluteals, piriformis Therex: Nustep LE's and Ue's, 6 min, level 5 Instructed in therex as described below, to begin strengthening ex 01/19/23 Nustep L 5 5 min Resisted gait 4 way 30 # each Leg Press 30# 3 sets 10 feet 3 way. Calf raises 30# 2 sets 10 Feet on ball bridge, KTC and obliques Tband hip flex and clams 2 sets 10 Tband SLR with abd 10 x PROM RT LE- minimal tightness HS ,IT and piriformis Prone hip IR/ER pain and tightness DSTW to RT piriformis with and without rotation Ionto #1 to RT piriformis       01/16/23 Education    PATIENT EDUCATION:  Education details: POC Person educated: Patient Education method: Explanation Education comprehension: verbalized understanding  HOME EXERCISE PROGRAM: Has some exercises including figure 4, glut stretch, SLR with strap, bridge, clamshells, and a few others. Access Code: [email or web address] URL: [email or web address] Date: 01/22/2023 Prepared by: Caralee Ates  Exercises - Figure 4 Bridge  - 1 x daily - 3 x weekly - 3 sets - 10 reps - Sidelying Hip Abduction  - 1 x daily - 3 x weekly - 3 sets - 10 reps - Seated Hip Abduction with Resistance  - 1 x daily - 7 x weekly - 3 sets - 10 reps ASSESSMENT:  CLINICAL IMPRESSION:  Pt arrives feeling overall stronger and less pain, felt DN helped so we did again and multi LTRs by MAlbright PT. Progressed strengthening with emphasis on hip ABD and rotators. Goals assessed.  IMPAIRMENTS: Abnormal gait, decreased activity tolerance, decreased mobility, difficulty walking, decreased ROM, decreased strength, impaired flexibility, postural dysfunction, and pain.   ACTIVITY LIMITATIONS: lifting, bending, squatting, and locomotion level  PARTICIPATION LIMITATIONS: meal prep, cleaning, laundry, shopping, and community activity  PERSONAL FACTORS: Past/current experiences are also affecting patient's functional outcome.   REHAB POTENTIAL: Good  CLINICAL DECISION  MAKING: Stable/uncomplicated  EVALUATION COMPLEXITY: Low   GOALS: Goals reviewed with patient? Yes  SHORT TERM GOALS: Target date: 02/02/23  I with initial HEP Baseline: Goal status: 01/19/23 MET  LONG TERM GOALS: Target date: 03/27/23  I with final HEP Baseline:  Goal status: INITIAL. 01/30/23 evolving  2.  Patient will improve her FOTO score to at least 63 Baseline: 51.9 Goal status: INITIAL  3.  Patient will be able to climb a flight of steps, up and down, with U handrail, MI, no pain in R hip Baseline: Modified climbing due to pain in R hip Goal status: INITIAL  01/30/23 progressing  4.  Patient will be able to return to her normal walking routine with no pain in R hip, normalized gait pattern Baseline: Reports severely restricted distances due to pain. Shortened step length. Goal status: INITIAL  01/30/23 progressing  5.  Patient will be able to get down to the floor and back up in order to return to her normal gardening activities Baseline: Modified Goal status: INITIAL  PLAN:  PT FREQUENCY: 1-2x/week  PT DURATION: 10 weeks  PLANNED INTERVENTIONS: Therapeutic exercises, Therapeutic activity, Neuromuscular re-education, Balance training, Gait training, Patient/Family education, Self  Care, Joint mobilization, Dry Needling, Electrical stimulation, Cryotherapy, Moist heat, Ionotophoresis 4mg /ml Dexamethasone, and Manual therapy.  PLAN FOR NEXT SESSION: Deep tissue mobs to piriformis, R > L. Continue dry needling Hip strengthening  Patient Details  Name: Taylor Anderson MRN: [id number] Date of Birth: 1950-08-29 Referring Provider:  Judi Saa, DO  Encounter Date: 01/30/2023   Suanne Marker, PTA 01/30/2023, 8:39 AM  [facility] [address] [place], [place], [address] Phone: [phone]   Fax:  [phone]

## 2023-02-01 ENCOUNTER — Ambulatory Visit: Payer: Medicare HMO | Admitting: Physical Therapy

## 2023-02-01 ENCOUNTER — Encounter: Payer: Self-pay | Admitting: Physical Therapy

## 2023-02-01 DIAGNOSIS — R262 Difficulty in walking, not elsewhere classified: Secondary | ICD-10-CM | POA: Diagnosis not present

## 2023-02-01 DIAGNOSIS — M6281 Muscle weakness (generalized): Secondary | ICD-10-CM

## 2023-02-01 DIAGNOSIS — M25551 Pain in right hip: Secondary | ICD-10-CM

## 2023-02-01 NOTE — Therapy (Signed)
OUTPATIENT PHYSICAL THERAPY THORACOLUMBAR TREATMENT   Patient Name: Taylor Anderson MRN: [id number] DOB:02/21/1951, 72 y.o., female Today's Date: 02/01/2023  END OF SESSION:  PT End of Session - 02/01/23 0756     Visit Number 6    Date for PT Re-Evaluation 03/27/23    PT Start Time 0755    PT Stop Time 0835    PT Time Calculation (min) 40 min    Activity Tolerance Patient tolerated treatment well    Behavior During Therapy [facility] for tasks assessed/performed               Past Medical History:  Diagnosis Date   Back pain    Enlarged thyroid 2012   2 nodules, biopsy showing goiter   Migraines    between 1980-1983   Osteopenia    Pleurisy 1986   Pneumonia    hx of   Shingles    Past Surgical History:  Procedure Laterality Date   COLONOSCOPY W/ POLYPECTOMY  2002   DILATION AND CURETTAGE OF UTERUS  1993   TONSILLECTOMY AND ADENOIDECTOMY  1959   TUBAL LIGATION  1993   Patient Active Problem List   Diagnosis Date Noted   Right hip pain 11/09/2022   Osteopenia 09/01/2022   Dermatochalasis of both upper eyelids 12/28/2017   Adie's tonic pupil, 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   Astigmatism with presbyopia, bilateral 12/25/2016   Family history of glaucoma 12/25/2016   Nuclear sclerotic cataract of both eyes 12/25/2016   Posterior vitreous detachment of both eyes 12/25/2016   Chronic cough 01/03/2011    PCP: Irena Reichmann, DO  REFERRING PROVIDER: Judi Saa, DO   REFERRING DIAG:  [phone] (ICD-10-CM) - Right hip pain  M54.50 (ICD-10-CM) - Lumbar spine pain    Rationale for Evaluation and Treatment: Rehabilitation  THERAPY DIAG:  Difficulty in walking, not elsewhere classified  Muscle weakness (generalized)  Pain in right hip  ONSET DATE: 12/27/22  SUBJECTIVE:                                                                                                                                                                                           SUBJECTIVE STATEMENT:  Patient reports that she is feeling much better. Able to climb steps without hanging onto the railing.  PERTINENT HISTORY:  Per referring provider note Back exam shows does have some loss of lordosis noted.  Some tenderness to palpation in the paraspinal musculature but very minimal.   More on the gluteal area than anywhere else.  Tightness with FABER test.  Negative straight leg test  Received injection for similar pain on 11/08/22 along with HEP. PAIN:  Are you having pain? Yes: NPRS scale: 1/10 Pain location: R glut pain, can work its way down to my knee  Pain description: aching Aggravating factors: Stair climbing, sitting for a period of time. Relieving factors: Advil,  dry needling   PRECAUTIONS: None  WEIGHT BEARING RESTRICTIONS: No  FALLS:  Has patient fallen in last 6 months? No  LIVING ENVIRONMENT: Lives with: lives with their family Lives in: House/apartment Stairs: Yes: Internal: 14 steps; on left going up and External: 2 steps; bilateral but cannot reach both Has following equipment at home: None  OCCUPATION: Retired, very limited walking- slower and shorter strides  PLOF: Independent  PATIENT GOALS: Return to her normal daily activities and normal walking routine without pain.  NEXT MD VISIT: about 2 months  OBJECTIVE:   DIAGNOSTIC FINDINGS:  11/11/22 IMPRESSION: 1. Mild-to-moderate multilevel degenerative disc and endplate changes, worst at L4-5. 2. Grade 1 anterolisthesis of L4 on L5.  PATIENT SURVEYS:  FOTO 51.9  SCREENING FOR RED FLAGS: Bowel or bladder incontinence: No Spinal tumors: No Cauda equina syndrome: No Compression fracture: No Abdominal aneurysm: No  COGNITION: Overall cognitive status: Within functional limits for tasks assessed     SENSATION: Chronic R toe numbness, no changes  MUSCLE LENGTH: Hamstrings: Right 65 deg; Left 70 deg Thomas test: WFL B  POSTURE: rounded shoulders, decreased lumbar lordosis, and decreased  thoracic kyphosis  PALPATION: TTP over B piriformis, R > L  LUMBAR ROM: WFL, noted some tightness in flex and B rotation,but no pain.  LOWER EXTREMITY ROM:   Limited in R hip flex, ER, IR, otherwise WFL B   LOWER EXTREMITY MMT:  B hips 4-/5 in ext, 4/5 Abd, otherwise 5/5   LUMBAR SPECIAL TESTS:  Straight leg raise test: Negative, Slump test: Negative, Single leg stance test: Positive, and FABER test: Positive  GAIT: Distance walked: In clinic distances Assistive device utilized: None Level of assistance: Complete Independence Comments: Antalgic gait on R, mild, increase lateral hip excursion.  TODAY'S TREATMENT:                                                                                                                              DATE:  02/01/23 NuStep L5 x 6 minutes Deep STM to R piriformis and gluts F/B stretch with pressure Supine stretch-figure 4, glut Self Deep tissue mobs with tennis ball Supine strength clamshells, bridge, bridge with band resistance, 10 each Seated HS stretch Mini squats, side to side step with G tband resistance.  01/30/23 DN RT piriformis by Octavio Graves PT Leg Press feet 3 way 10 x each. Calf raises 2 sets 15 BOSU squats, fwd step ups and lateral step ups 10 x each Green tband hip IR/ER 2 sets 10 STS with green tband at knees holding ABD 15 x TM side stepping 1.2 min each way. BIL pull TM OFF 20 x Dead lifts 5# 10 x Reviewed HEPs that pt brought  01/24/23 TherEx Nustep L4x6  minutes BLEs  Bridge + ABD into red TB x12 Sidelying hip ABD 0# x12 B Walking bridges x6 Brief round of deep MFR to R piriformis  Selfcare Discussed frequency of prior exercise programming from past PT and MDs in addition to resistance exercises given here, encouraged warmup before exercise such as walking   01/22/23:  Manual:  Trigger Point Dry-Needling  Treatment instructions: Expect mild to moderate muscle soreness. S/S of pneumothorax if dry needled over a lung  field, and to seek immediate medical attention should they occur. Patient verbalized understanding of these instructions and education. Patient Consent Given: Yes Education handout provided: Yes Muscles treated: R gluteus medius, R gluteus minimus, R TFL, R piriformis Electrical stimulation performed: No Parameters: N/A Treatment response/outcome: Twitch Response Elicited and Palpable Increase in Muscle Length   Deep pressure, cross fricotion massage R gluteals, piriformis Therex: Nustep LE's and Ue's, 6 min, level 5 Instructed in therex as described below, to begin strengthening ex 01/19/23 Nustep L 5 5 min Resisted gait 4 way 30 # each Leg Press 30# 3 sets 10 feet 3 way. Calf raises 30# 2 sets 10 Feet on ball bridge, KTC and obliques Tband hip flex and clams 2 sets 10 Tband SLR with abd 10 x PROM RT LE- minimal tightness HS ,IT and piriformis Prone hip IR/ER pain and tightness DSTW to RT piriformis with and without rotation Ionto #1 to RT piriformis       01/16/23 Education    PATIENT EDUCATION:  Education details: POC Person educated: Patient Education method: Explanation Education comprehension: verbalized understanding  HOME EXERCISE PROGRAM: Has some exercises including figure 4, glut stretch, SLR with strap, bridge, clamshells, and a few others. Access Code: [email or web address] URL: [email or web address] Date: 01/22/2023 Prepared by: Caralee Ates  Exercises - Figure 4 Bridge  - 1 x daily - 3 x weekly - 3 sets - 10 reps - Sidelying Hip Abduction  - 1 x daily - 3 x weekly - 3 sets - 10 reps - Seated Hip Abduction with Resistance  - 1 x daily - 7 x weekly - 3 sets - 10 reps ASSESSMENT:  CLINICAL IMPRESSION: Patient reports improved pain, able to climb up and down steps without hanging on the rail. Provided deep tissue mobs, then updated HEP, practicing each new stretch and strength move as well as education for self deep tissue mobs.   IMPAIRMENTS: Abnormal  gait, decreased activity tolerance, decreased mobility, difficulty walking, decreased ROM, decreased strength, impaired flexibility, postural dysfunction, and pain.   ACTIVITY LIMITATIONS: lifting, bending, squatting, and locomotion level  PARTICIPATION LIMITATIONS: meal prep, cleaning, laundry, shopping, and community activity  PERSONAL FACTORS: Past/current experiences are also affecting patient's functional outcome.   REHAB POTENTIAL: Good  CLINICAL DECISION MAKING: Stable/uncomplicated  EVALUATION COMPLEXITY: Low  GOALS: Goals reviewed with patient? Yes  SHORT TERM GOALS: Target date: 02/02/23  I with initial HEP Baseline: Goal status: 01/19/23 MET  LONG TERM GOALS: Target date: 03/27/23  I with final HEP Baseline:  Goal status: 01/30/23 evolving  2.  Patient will improve her FOTO score to at least 63 Baseline: 51.9 Goal status: INITIAL  3.  Patient will be able to climb a flight of steps, up and down, with U handrail, MI, no pain in R hip Baseline: Modified climbing due to pain in R hip Goal status: 01/30/23 progressing  4.  Patient will be able to return to her normal walking routine with no pain in R hip, normalized gait pattern Baseline: Reports severely restricted  distances due to pain. Shortened step length. Goal status: INITIAL  01/30/23 progressing  5.  Patient will be able to get down to the floor and back up in order to return to her normal gardening activities Baseline: Modified Goal status: INITIAL  PLAN:  PT FREQUENCY: 1-2x/week  PT DURATION: 10 weeks  PLANNED INTERVENTIONS: Therapeutic exercises, Therapeutic activity, Neuromuscular re-education, Balance training, Gait training, Patient/Family education, Self Care, Joint mobilization, Dry Needling, Electrical stimulation, Cryotherapy, Moist heat, Ionotophoresis 4mg /ml Dexamethasone, and Manual therapy.  PLAN FOR NEXT SESSION: Deep tissue mobs to piriformis, R > L. Continue dry needling Hip  strengthening  Patient Details  Name: Eliese Kerwood MRN: [id number] Date of Birth: January 29, 1951 Referring Provider:  Judi Saa, DO  Encounter Date: 02/01/2023  Iona Beard, DPT 02/01/2023, 8:50 AM  [facility] [address] [place], [place], [address] Phone: [phone]   Fax:  [phone]

## 2023-02-06 ENCOUNTER — Encounter: Payer: Self-pay | Admitting: Physical Therapy

## 2023-02-06 ENCOUNTER — Ambulatory Visit: Payer: Medicare HMO | Admitting: Physical Therapy

## 2023-02-06 DIAGNOSIS — M6281 Muscle weakness (generalized): Secondary | ICD-10-CM

## 2023-02-06 DIAGNOSIS — R262 Difficulty in walking, not elsewhere classified: Secondary | ICD-10-CM

## 2023-02-06 DIAGNOSIS — M25551 Pain in right hip: Secondary | ICD-10-CM

## 2023-02-06 NOTE — Therapy (Signed)
OUTPATIENT PHYSICAL THERAPY THORACOLUMBAR TREATMENT   Patient Name: Taylor Anderson MRN: [id number] DOB:1951-02-14, 71 y.o., female Today's Date: 02/06/2023  END OF SESSION:  PT End of Session - 02/06/23 0847     Visit Number 7    Date for PT Re-Evaluation 03/27/23    PT Start Time 0845    PT Stop Time 0925    PT Time Calculation (min) 40 min    Activity Tolerance Patient tolerated treatment well    Behavior During Therapy [facility] for tasks assessed/performed               Past Medical History:  Diagnosis Date   Back pain    Enlarged thyroid 2012   2 nodules, biopsy showing goiter   Migraines    between 1980-1983   Osteopenia    Pleurisy 1986   Pneumonia    hx of   Shingles    Past Surgical History:  Procedure Laterality Date   COLONOSCOPY W/ POLYPECTOMY  2002   DILATION AND CURETTAGE OF UTERUS  1993   TONSILLECTOMY AND ADENOIDECTOMY  1959   TUBAL LIGATION  1993   Patient Active Problem List   Diagnosis Date Noted   Right hip pain 11/09/2022   Osteopenia 09/01/2022   Dermatochalasis of both upper eyelids 12/28/2017   Adie's tonic pupil, 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   Astigmatism with presbyopia, bilateral 12/25/2016   Family history of glaucoma 12/25/2016   Nuclear sclerotic cataract of both eyes 12/25/2016   Posterior vitreous detachment of both eyes 12/25/2016   Chronic cough 01/03/2011    PCP: Irena Reichmann, DO  REFERRING PROVIDER: Judi Saa, DO   REFERRING DIAG:  [phone] (ICD-10-CM) - Right hip pain  M54.50 (ICD-10-CM) - Lumbar spine pain    Rationale for Evaluation and Treatment: Rehabilitation  THERAPY DIAG:  Difficulty in walking, not elsewhere classified  Muscle weakness (generalized)  Pain in right hip  ONSET DATE: 12/27/22  SUBJECTIVE:                                                                                                                                                                                           SUBJECTIVE STATEMENT:  Patient reports that she continues to feel better. She tends to get stiffer in the evening if she sits after being active all day. HEP is going well.  PERTINENT HISTORY:  Per referring provider note Back exam shows does have some loss of lordosis noted.  Some tenderness to palpation in the paraspinal musculature but very minimal.   More on the gluteal area than anywhere else.  Tightness with FABER test.  Negative straight leg test  Received  injection for similar pain on 11/08/22 along with HEP. PAIN:  Are you having pain? Yes: NPRS scale: 1/10 Pain location: R glut pain, can work its way down to my knee  Pain description: aching Aggravating factors: Stair climbing, sitting for a period of time. Relieving factors: Advil,  dry needling   PRECAUTIONS: None  WEIGHT BEARING RESTRICTIONS: No  FALLS:  Has patient fallen in last 6 months? No  LIVING ENVIRONMENT: Lives with: lives with their family Lives in: House/apartment Stairs: Yes: Internal: 14 steps; on left going up and External: 2 steps; bilateral but cannot reach both Has following equipment at home: None  OCCUPATION: Retired, very limited walking- slower and shorter strides  PLOF: Independent  PATIENT GOALS: Return to her normal daily activities and normal walking routine without pain.  NEXT MD VISIT: about 2 months  OBJECTIVE:   DIAGNOSTIC FINDINGS:  11/11/22 IMPRESSION: 1. Mild-to-moderate multilevel degenerative disc and endplate changes, worst at L4-5. 2. Grade 1 anterolisthesis of L4 on L5.  PATIENT SURVEYS:  FOTO 51.9  SCREENING FOR RED FLAGS: Bowel or bladder incontinence: No Spinal tumors: No Cauda equina syndrome: No Compression fracture: No Abdominal aneurysm: No  COGNITION: Overall cognitive status: Within functional limits for tasks assessed     SENSATION: Chronic R toe numbness, no changes  MUSCLE LENGTH: Hamstrings: Right 65 deg; Left 70 deg Thomas test: WFL B  POSTURE: rounded shoulders,  decreased lumbar lordosis, and decreased thoracic kyphosis  PALPATION: TTP over B piriformis, R > L  LUMBAR ROM: WFL, noted some tightness in flex and B rotation,but no pain.  LOWER EXTREMITY ROM:   Limited in R hip flex, ER, IR, otherwise WFL B   LOWER EXTREMITY MMT:  B hips 4-/5 in ext, 4/5 Abd, otherwise 5/5   LUMBAR SPECIAL TESTS:  Straight leg raise test: Negative, Slump test: Negative, Single leg stance test: Positive, and FABER test: Positive  GAIT: Distance walked: In clinic distances Assistive device utilized: None Level of assistance: Complete Independence Comments: Antalgic gait on R, mild, increase lateral hip excursion.  TODAY'S TREATMENT:                                                                                                                              DATE:  02/06/23 NuStep L5 x 7 minutes Standing on upside down BOSU for proprioceptive awareness. Static, lateral and ant/post weight shifts, circles in each direction. Good excursion and control noted. Step ups onto [employer], driving opposite knee to 90 degrees, minimized UE support x 10 each leg Standing hip abd, ext with 3# weights, 5 x 5 each Standing hip flexor stretch, 2 x 15 sec each DN to R piriformis by Otelia Santee, PT Standing hip abductor stretch Figure 4 to stretch piriformis  02/01/23 NuStep L5 x 6 minutes Deep STM to R piriformis and gluts F/B stretch with pressure Supine stretch-figure 4, glut Self Deep tissue mobs with tennis ball Supine strength clamshells, bridge, bridge with band  resistance, 10 each Seated HS stretch Mini squats, side to side step with G tband resistance.  01/30/23 DN RT piriformis by Octavio Graves PT Leg Press feet 3 way 10 x each. Calf raises 2 sets 15 BOSU squats, fwd step ups and lateral step ups 10 x each Green tband hip IR/ER 2 sets 10 STS with green tband at knees holding ABD 15 x TM side stepping 1.2 min each way. BIL pull TM OFF 20 x Dead lifts 5# 10  x Reviewed HEPs that pt brought  01/24/23 TherEx Nustep L4x6 minutes BLEs  Bridge + ABD into red TB x12 Sidelying hip ABD 0# x12 B Walking bridges x6 Brief round of deep MFR to R piriformis  Selfcare Discussed frequency of prior exercise programming from past PT and MDs in addition to resistance exercises given here, encouraged warmup before exercise such as walking   01/22/23:  Manual:  Trigger Point Dry-Needling  Treatment instructions: Expect mild to moderate muscle soreness. S/S of pneumothorax if dry needled over a lung field, and to seek immediate medical attention should they occur. Patient verbalized understanding of these instructions and education. Patient Consent Given: Yes Education handout provided: Yes Muscles treated: R gluteus medius, R gluteus minimus, R TFL, R piriformis Electrical stimulation performed: No Parameters: N/A Treatment response/outcome: Twitch Response Elicited and Palpable Increase in Muscle Length   Deep pressure, cross fricotion massage R gluteals, piriformis Therex: Nustep LE's and Ue's, 6 min, level 5 Instructed in therex as described below, to begin strengthening ex 01/19/23 Nustep L 5 5 min Resisted gait 4 way 30 # each Leg Press 30# 3 sets 10 feet 3 way. Calf raises 30# 2 sets 10 Feet on ball bridge, KTC and obliques Tband hip flex and clams 2 sets 10 Tband SLR with abd 10 x PROM RT LE- minimal tightness HS ,IT and piriformis Prone hip IR/ER pain and tightness DSTW to RT piriformis with and without rotation Ionto #1 to RT piriformis  01/16/23 Education   PATIENT EDUCATION:  Education details: POC Person educated: Patient Education method: Explanation Education comprehension: verbalized understanding  HOME EXERCISE PROGRAM: Has some exercises including figure 4, glut stretch, SLR with strap, bridge, clamshells, and a few others. Access Code: [email or web address] URL: [email or web address] Date: 01/22/2023 Prepared by: Caralee Ates  Exercises - Figure 4 Bridge  - 1 x daily - 3 x weekly - 3 sets - 10 reps - Sidelying Hip Abduction  - 1 x daily - 3 x weekly - 3 sets - 10 reps - Seated Hip Abduction with Resistance  - 1 x daily - 7 x weekly - 3 sets - 10 reps ASSESSMENT:  CLINICAL IMPRESSION: Patient reports continued improvement. Still gets stiff after sitting, but much improved on the steps. Continued with treatment plan for STM to piriformis, stretch, and strengthening to entire lower body and LE.   IMPAIRMENTS: Abnormal gait, decreased activity tolerance, decreased mobility, difficulty walking, decreased ROM, decreased strength, impaired flexibility, postural dysfunction, and pain.   ACTIVITY LIMITATIONS: lifting, bending, squatting, and locomotion level  PARTICIPATION LIMITATIONS: meal prep, cleaning, laundry, shopping, and community activity  PERSONAL FACTORS: Past/current experiences are also affecting patient's functional outcome.   REHAB POTENTIAL: Good  CLINICAL DECISION MAKING: Stable/uncomplicated  EVALUATION COMPLEXITY: Low  GOALS: Goals reviewed with patient? Yes  SHORT TERM GOALS: Target date: 02/02/23  I with initial HEP Baseline: Goal status: 01/19/23 MET  LONG TERM GOALS: Target date: 03/27/23  I with final HEP Baseline:  Goal status: 01/30/23 evolving  2.  Patient will improve her FOTO score to at least 63 Baseline: 51.9 Goal status: INITIAL  3.  Patient will be able to climb a flight of steps, up and down, with U handrail, MI, no pain in R hip Baseline: Modified climbing due to pain in R hip Goal status: 01/30/23 progressing  4.  Patient will be able to return to her normal walking routine with no pain in R hip, normalized gait pattern Baseline: Reports severely restricted distances due to pain. Shortened step length. Goal status: INITIAL  01/30/23 progressing  5.  Patient will be able to get down to the floor and back up in order to return to her normal gardening  activities Baseline: Modified Goal status: INITIAL  PLAN:  PT FREQUENCY: 1-2x/week  PT DURATION: 10 weeks  PLANNED INTERVENTIONS: Therapeutic exercises, Therapeutic activity, Neuromuscular re-education, Balance training, Gait training, Patient/Family education, Self Care, Joint mobilization, Dry Needling, Electrical stimulation, Cryotherapy, Moist heat, Ionotophoresis 4mg /ml Dexamethasone, and Manual therapy.  PLAN FOR NEXT SESSION: Deep tissue mobs to piriformis, R > L. Continue dry needling Hip strengthening  Patient Details  Name: Taylor Anderson MRN: [id number] Date of Birth: Jun 09, 1951 Referring Provider:  Judi Saa, DO  Encounter Date: 02/06/2023  Iona Beard, DPT 02/06/2023, 8:49 AM  [place]  [facility] [address] [place], [place], [address] Phone: [phone]   Fax:  [phone]

## 2023-02-09 ENCOUNTER — Ambulatory Visit: Payer: Medicare HMO | Admitting: Physical Therapy

## 2023-02-09 DIAGNOSIS — M25551 Pain in right hip: Secondary | ICD-10-CM

## 2023-02-09 DIAGNOSIS — M6281 Muscle weakness (generalized): Secondary | ICD-10-CM

## 2023-02-09 DIAGNOSIS — R262 Difficulty in walking, not elsewhere classified: Secondary | ICD-10-CM | POA: Diagnosis not present

## 2023-02-09 NOTE — Therapy (Signed)
OUTPATIENT PHYSICAL THERAPY THORACOLUMBAR TREATMENT   Patient Name: Taylor Anderson MRN: [id number] DOB:05-25-1951, 72 y.o., female Today's Date: 02/09/2023  END OF SESSION:  PT End of Session - 02/09/23 0834     Visit Number 8    Date for PT Re-Evaluation 03/27/23    PT Start Time 0835    PT Stop Time 0920    PT Time Calculation (min) 45 min               Past Medical History:  Diagnosis Date   Back pain    Enlarged thyroid 2012   2 nodules, biopsy showing goiter   Migraines    between 1980-1983   Osteopenia    Pleurisy 1986   Pneumonia    hx of   Shingles    Past Surgical History:  Procedure Laterality Date   COLONOSCOPY W/ POLYPECTOMY  2002   DILATION AND CURETTAGE OF UTERUS  1993   TONSILLECTOMY AND ADENOIDECTOMY  1959   TUBAL LIGATION  1993   Patient Active Problem List   Diagnosis Date Noted   Right hip pain 11/09/2022   Osteopenia 09/01/2022   Dermatochalasis of both upper eyelids 12/28/2017   Adie's tonic pupil, 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   Astigmatism with presbyopia, bilateral 12/25/2016   Family history of glaucoma 12/25/2016   Nuclear sclerotic cataract of both eyes 12/25/2016   Posterior vitreous detachment of both eyes 12/25/2016   Chronic cough 01/03/2011    PCP: Irena Reichmann, DO  REFERRING PROVIDER: Judi Saa, DO   REFERRING DIAG:  [phone] (ICD-10-CM) - Right hip pain  M54.50 (ICD-10-CM) - Lumbar spine pain    Rationale for Evaluation and Treatment: Rehabilitation  THERAPY DIAG:  Pain in right hip  Muscle weakness (generalized)  ONSET DATE: 12/27/22  SUBJECTIVE:                                                                                                                                                                                           SUBJECTIVE STATEMENT: overall 90% better. Right up steps no issue. Only issue is prolonged sitting at night  PERTINENT HISTORY:  Per referring provider note Back exam shows does have  some loss of lordosis noted.  Some tenderness to palpation in the paraspinal musculature but very minimal.   More on the gluteal area than anywhere else.  Tightness with FABER test.  Negative straight leg test  Received injection for similar pain on 11/08/22 along with HEP. PAIN:  Are you having pain? Yes: NPRS scale: 1/10 Pain location: R glut pain, can work its way down to my knee  Pain description: aching Aggravating factors: Stair  climbing, sitting for a period of time. Relieving factors: Advil,  dry needling   PRECAUTIONS: None  WEIGHT BEARING RESTRICTIONS: No  FALLS:  Has patient fallen in last 6 months? No  LIVING ENVIRONMENT: Lives with: lives with their family Lives in: House/apartment Stairs: Yes: Internal: 14 steps; on left going up and External: 2 steps; bilateral but cannot reach both Has following equipment at home: None  OCCUPATION: Retired, very limited walking- slower and shorter strides  PLOF: Independent  PATIENT GOALS: Return to her normal daily activities and normal walking routine without pain.  NEXT MD VISIT: about 2 months  OBJECTIVE:   DIAGNOSTIC FINDINGS:  11/11/22 IMPRESSION: 1. Mild-to-moderate multilevel degenerative disc and endplate changes, worst at L4-5. 2. Grade 1 anterolisthesis of L4 on L5.  PATIENT SURVEYS:  FOTO 51.9  SCREENING FOR RED FLAGS: Bowel or bladder incontinence: No Spinal tumors: No Cauda equina syndrome: No Compression fracture: No Abdominal aneurysm: No  COGNITION: Overall cognitive status: Within functional limits for tasks assessed     SENSATION: Chronic R toe numbness, no changes  MUSCLE LENGTH: Hamstrings: Right 65 deg; Left 70 deg Thomas test: WFL B  POSTURE: rounded shoulders, decreased lumbar lordosis, and decreased thoracic kyphosis  PALPATION: TTP over B piriformis, R > L  LUMBAR ROM: WFL, noted some tightness in flex and B rotation,but no pain.  LOWER EXTREMITY ROM:   Limited in R hip flex,  ER, IR, otherwise WFL B   LOWER EXTREMITY MMT:  B hips 4-/5 in ext, 4/5 Abd, otherwise 5/5   LUMBAR SPECIAL TESTS:  Straight leg raise test: Negative, Slump test: Negative, Single leg stance test: Positive, and FABER test: Positive  GAIT: Distance walked: In clinic distances Assistive device utilized: None Level of assistance: Complete Independence Comments: Antalgic gait on R, mild, increase lateral hip excursion.  TODAY'S TREATMENT:                                                                                                                              DATE:   02/09/23 Nustep L 5 10# hip cable pulleys 4 way BIL 12x each 30# resisted gait with step up fwd and laterally 5 x each 30# SL leg press 12 x each 40# leg press BIL with 12 x with IR and 12 x toes ER Bridge with ball squeeze 12 x Bridge with Gtband clams SLR with ABD red tband tband 2 sets 10 SL bridge feet on ball 10 x each HS curl bridge with feet on ball 10 x  Open/close gate 2 sets 10 BIL    02/06/23 NuStep L5 x 7 minutes Standing on upside down BOSU for proprioceptive awareness. Static, lateral and ant/post weight shifts, circles in each direction. Good excursion and control noted. Step ups onto Airex, driving opposite knee to 90 degrees, minimized UE support x 10 each leg Standing hip abd, ext with 3# weights, 5 x 5 each Standing hip flexor stretch, 2 x 15  sec each DN to R piriformis by Otelia Santee, PT Standing hip abductor stretch Figure 4 to stretch piriformis  02/01/23 NuStep L5 x 6 minutes Deep STM to R piriformis and gluts F/B stretch with pressure Supine stretch-figure 4, glut Self Deep tissue mobs with tennis ball Supine strength clamshells, bridge, bridge with band resistance, 10 each Seated HS stretch Mini squats, side to side step with G tband resistance.  01/30/23 DN RT piriformis by Octavio Graves PT Leg Press feet 3 way 10 x each. Calf raises 2 sets 15 BOSU squats, fwd step ups and  lateral step ups 10 x each Green tband hip IR/ER 2 sets 10 STS with green tband at knees holding ABD 15 x TM side stepping 1.2 min each way. BIL pull TM OFF 20 x Dead lifts 5# 10 x Reviewed HEPs that pt brought  01/24/23 TherEx Nustep L4x6 minutes BLEs  Bridge + ABD into red TB x12 Sidelying hip ABD 0# x12 B Walking bridges x6 Brief round of deep MFR to R piriformis  Selfcare Discussed frequency of prior exercise programming from past PT and MDs in addition to resistance exercises given here, encouraged warmup before exercise such as walking   01/22/23:  Manual:  Trigger Point Dry-Needling  Treatment instructions: Expect mild to moderate muscle soreness. S/S of pneumothorax if dry needled over a lung field, and to seek immediate medical attention should they occur. Patient verbalized understanding of these instructions and education. Patient Consent Given: Yes Education handout provided: Yes Muscles treated: R gluteus medius, R gluteus minimus, R TFL, R piriformis Electrical stimulation performed: No Parameters: N/A Treatment response/outcome: Twitch Response Elicited and Palpable Increase in Muscle Length   Deep pressure, cross fricotion massage R gluteals, piriformis Therex: Nustep LE's and Ue's, 6 min, level 5 Instructed in therex as described below, to begin strengthening ex 01/19/23 Nustep L 5 5 min Resisted gait 4 way 30 # each Leg Press 30# 3 sets 10 feet 3 way. Calf raises 30# 2 sets 10 Feet on ball bridge, KTC and obliques Tband hip flex and clams 2 sets 10 Tband SLR with abd 10 x PROM RT LE- minimal tightness HS ,IT and piriformis Prone hip IR/ER pain and tightness DSTW to RT piriformis with and without rotation Ionto #1 to RT piriformis  01/16/23 Education   PATIENT EDUCATION:  Education details: POC Person educated: Patient Education method: Explanation Education comprehension: verbalized understanding  HOME EXERCISE PROGRAM: Has some exercises including  figure 4, glut stretch, SLR with strap, bridge, clamshells, and a few others. Access Code: [email or web address] URL: [email or web address] Date: 01/22/2023 Prepared by: Caralee Ates  Exercises - Figure 4 Bridge  - 1 x daily - 3 x weekly - 3 sets - 10 reps - Sidelying Hip Abduction  - 1 x daily - 3 x weekly - 3 sets - 10 reps - Seated Hip Abduction with Resistance  - 1 x daily - 7 x weekly - 3 sets - 10 reps ASSESSMENT:  CLINICAL IMPRESSION: Pt arrives 90% better. Steps are not an issue. Still pain with prolonged sitting but work to move more or atleast move leg before standing. Goals assess and she is progressing. Continued to progress hip strength with cuing. Pt will be OOT next week then will return and if still doing well discussed D/C vs possible 2-3 week hold. IMPAIRMENTS: Abnormal gait, decreased activity tolerance, decreased mobility, difficulty walking, decreased ROM, decreased strength, impaired flexibility, postural dysfunction, and pain.   ACTIVITY LIMITATIONS: lifting, bending, squatting, and locomotion  level  PARTICIPATION LIMITATIONS: meal prep, cleaning, laundry, shopping, and community activity  PERSONAL FACTORS: Past/current experiences are also affecting patient's functional outcome.   REHAB POTENTIAL: Good  CLINICAL DECISION MAKING: Stable/uncomplicated  EVALUATION COMPLEXITY: Low  GOALS: Goals reviewed with patient? Yes  SHORT TERM GOALS: Target date: 02/02/23  I with initial HEP Baseline: Goal status: 01/19/23 MET  LONG TERM GOALS: Target date: 03/27/23  I with final HEP Baseline:  Goal status: 01/30/23 evolving  02/09/23 MET  2.  Patient will improve her FOTO score to at least 63 Baseline: 51.9 Goal status: INITIAL  3.  Patient will be able to climb a flight of steps, up and down, with U handrail, MI, no pain in R hip Baseline: Modified climbing due to pain in R hip Goal status: 01/30/23 progressing  02/09/23 MET  4.  Patient will be able to return to  her normal walking routine with no pain in R hip, normalized gait pattern Baseline: Reports severely restricted distances due to pain. Shortened step length. Goal status: INITIAL  01/30/23 progressing  Progressing 02/09/23  5.  Patient will be able to get down to the floor and back up in order to return to her normal gardening activities Baseline: Modified Goal status: INITIAL  02/09/23 progressing  PLAN:  PT FREQUENCY: 1-2x/week  PT DURATION: 10 weeks  PLANNED INTERVENTIONS: Therapeutic exercises, Therapeutic activity, Neuromuscular re-education, Balance training, Gait training, Patient/Family education, Self Care, Joint mobilization, Dry Needling, Electrical stimulation, Cryotherapy, Moist heat, Ionotophoresis 4mg /ml Dexamethasone, and Manual therapy.  PLAN FOR NEXT SESSION: pt will skip next week and see how she does and if all well can D/C when she returns or HOLD 2-3 weeks Patient Details  Name: Taylor Anderson MRN: [id number] Date of Birth: 1950/10/23 Referring Provider:  Judi Saa, DO   Patient Details  Name: Taylor Anderson MRN: [id number] Date of Birth: 1951-02-25 Referring Provider:  Judi Saa, DO  Encounter Date: 02/09/2023   Suanne Marker, PTA 02/09/2023, 8:35 AM  [facility] [address] [facility]. [place], [place], [address] Phone: [phone]   Fax:  [phone]

## 2023-02-20 ENCOUNTER — Ambulatory Visit: Payer: Medicare HMO | Admitting: Physical Therapy

## 2023-02-20 ENCOUNTER — Encounter: Payer: Self-pay | Admitting: Physical Therapy

## 2023-02-20 DIAGNOSIS — M6281 Muscle weakness (generalized): Secondary | ICD-10-CM

## 2023-02-20 DIAGNOSIS — M25551 Pain in right hip: Secondary | ICD-10-CM

## 2023-02-20 DIAGNOSIS — R262 Difficulty in walking, not elsewhere classified: Secondary | ICD-10-CM | POA: Diagnosis not present

## 2023-02-20 NOTE — Therapy (Signed)
OUTPATIENT PHYSICAL THERAPY THORACOLUMBAR TREATMENT   Patient Name: Taylor Anderson MRN: [id number] DOB:1951/05/25, 72 y.o., female Today's Date: 02/20/2023  END OF SESSION:  PT End of Session - 02/20/23 0847     Visit Number 9    Date for PT Re-Evaluation 03/27/23    PT Start Time 0841    PT Stop Time 0925    PT Time Calculation (min) 44 min    Activity Tolerance Patient tolerated treatment well    Behavior During Therapy [facility] for tasks assessed/performed             Past Medical History:  Diagnosis Date   Back pain    Enlarged thyroid 2012   2 nodules, biopsy showing goiter   Migraines    between 1980-1983   Osteopenia    Pleurisy 1986   Pneumonia    hx of   Shingles    Past Surgical History:  Procedure Laterality Date   COLONOSCOPY W/ POLYPECTOMY  2002   DILATION AND CURETTAGE OF UTERUS  1993   TONSILLECTOMY AND ADENOIDECTOMY  1959   TUBAL LIGATION  1993   Patient Active Problem List   Diagnosis Date Noted   Right hip pain 11/09/2022   Osteopenia 09/01/2022   Dermatochalasis of both upper eyelids 12/28/2017   Adie's tonic pupil, 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   Astigmatism with presbyopia, bilateral 12/25/2016   Family history of glaucoma 12/25/2016   Nuclear sclerotic cataract of both eyes 12/25/2016   Posterior vitreous detachment of both eyes 12/25/2016   Chronic cough 01/03/2011    PCP: Irena Reichmann, DO  REFERRING PROVIDER: Judi Saa, DO   REFERRING DIAG:  [phone] (ICD-10-CM) - Right hip pain  M54.50 (ICD-10-CM) - Lumbar spine pain    Rationale for Evaluation and Treatment: Rehabilitation  THERAPY DIAG:  Pain in right hip  Muscle weakness (generalized)  Difficulty in walking, not elsewhere classified  ONSET DATE: 12/27/22  SUBJECTIVE:                                                                                                                                                                                           SUBJECTIVE STATEMENT:  overall 90% better. Right up steps no issue. Only issue is prolonged sitting at night  PERTINENT HISTORY:  Per referring provider note Back exam shows does have some loss of lordosis noted.  Some tenderness to palpation in the paraspinal musculature but very minimal.   More on the gluteal area than anywhere else.  Tightness with FABER test.  Negative straight leg test  Received injection for similar pain on 11/08/22 along with HEP. PAIN:  Are you having pain?  Yes: NPRS scale: 1/10 Pain location: R glut pain, can work its way down to my knee  Pain description: aching Aggravating factors: Stair climbing, sitting for a period of time. Relieving factors: Advil,  dry needling   PRECAUTIONS: None  WEIGHT BEARING RESTRICTIONS: No  FALLS:  Has patient fallen in last 6 months? No  LIVING ENVIRONMENT: Lives with: lives with their family Lives in: House/apartment Stairs: Yes: Internal: 14 steps; on left going up and External: 2 steps; bilateral but cannot reach both Has following equipment at home: None  OCCUPATION: Retired, very limited walking- slower and shorter strides  PLOF: Independent  PATIENT GOALS: Return to her normal daily activities and normal walking routine without pain.  NEXT MD VISIT: about 2 months  OBJECTIVE:   DIAGNOSTIC FINDINGS:  11/11/22 IMPRESSION: 1. Mild-to-moderate multilevel degenerative disc and endplate changes, worst at L4-5. 2. Grade 1 anterolisthesis of L4 on L5.  PATIENT SURVEYS:  FOTO 51.9  SCREENING FOR RED FLAGS: Bowel or bladder incontinence: No Spinal tumors: No Cauda equina syndrome: No Compression fracture: No Abdominal aneurysm: No  COGNITION: Overall cognitive status: Within functional limits for tasks assessed     SENSATION: Chronic R toe numbness, no changes  MUSCLE LENGTH: Hamstrings: Right 65 deg; Left 70 deg Thomas test: WFL B  POSTURE: rounded shoulders, decreased lumbar lordosis, and decreased thoracic  kyphosis  PALPATION: TTP over B piriformis, R > L  LUMBAR ROM: WFL, noted some tightness in flex and B rotation,but no pain.  LOWER EXTREMITY ROM:   Limited in R hip flex, ER, IR, otherwise WFL B   LOWER EXTREMITY MMT:  B hips 4-/5 in ext, 4/5 Abd, otherwise 5/5   LUMBAR SPECIAL TESTS:  Straight leg raise test: Negative, Slump test: Negative, Single leg stance test: Positive, and FABER test: Positive  GAIT: Distance walked: In clinic distances Assistive device utilized: None Level of assistance: Complete Independence Comments: Antalgic gait on R, mild, increase lateral hip excursion.  TODAY'S TREATMENT:                                                                                                                              DATE:  02/20/23 NuStep L5 x 6 minutes Dynamic stretching- butt kicks, march, march with knee ext, HS stretch, hip mobility Standing shoulder ext, rows, ER Wall walks with resistance Heel raises on step Knee flex, 25#, 2 x 10, BLE Knee ext 10#, BLE, 2 x 10  02/09/23 Nustep L 5 10# hip cable pulleys 4 way BIL 12x each 30# resisted gait with step up fwd and laterally 5 x each 30# SL leg press 12 x each 40# leg press BIL with 12 x with IR and 12 x toes ER Bridge with ball squeeze 12 x Bridge with Gtband clams SLR with ABD red tband tband 2 sets 10 SL bridge feet on ball 10 x each HS curl bridge with feet on ball 10 x  Open/close gate 2  sets 10 BIL    02/06/23 NuStep L5 x 7 minutes Standing on upside down BOSU for proprioceptive awareness. Static, lateral and ant/post weight shifts, circles in each direction. Good excursion and control noted. Step ups onto Airex, driving opposite knee to 90 degrees, minimized UE support x 10 each leg Standing hip abd, ext with 3# weights, 5 x 5 each Standing hip flexor stretch, 2 x 15 sec each DN to R piriformis by Otelia Santee, PT Standing hip abductor stretch Figure 4 to stretch  piriformis  02/01/23 NuStep L5 x 6 minutes Deep STM to R piriformis and gluts F/B stretch with pressure Supine stretch-figure 4, glut Self Deep tissue mobs with tennis ball Supine strength clamshells, bridge, bridge with band resistance, 10 each Seated HS stretch Mini squats, side to side step with G tband resistance.  01/30/23 DN RT piriformis by Octavio Graves PT Leg Press feet 3 way 10 x each. Calf raises 2 sets 15 BOSU squats, fwd step ups and lateral step ups 10 x each Green tband hip IR/ER 2 sets 10 STS with green tband at knees holding ABD 15 x TM side stepping 1.2 min each way. BIL pull TM OFF 20 x Dead lifts 5# 10 x Reviewed HEPs that pt brought  01/24/23 TherEx Nustep L4x6 minutes BLEs  Bridge + ABD into red TB x12 Sidelying hip ABD 0# x12 B Walking bridges x6 Brief round of deep MFR to R piriformis  Selfcare Discussed frequency of prior exercise programming from past PT and MDs in addition to resistance exercises given here, encouraged warmup before exercise such as walking   01/22/23:  Manual:  Trigger Point Dry-Needling  Treatment instructions: Expect mild to moderate muscle soreness. S/S of pneumothorax if dry needled over a lung field, and to seek immediate medical attention should they occur. Patient verbalized understanding of these instructions and education. Patient Consent Given: Yes Education handout provided: Yes Muscles treated: R gluteus medius, R gluteus minimus, R TFL, R piriformis Electrical stimulation performed: No Parameters: N/A Treatment response/outcome: Twitch Response Elicited and Palpable Increase in Muscle Length   Deep pressure, cross fricotion massage R gluteals, piriformis Therex: Nustep LE's and Ue's, 6 min, level 5 Instructed in therex as described below, to begin strengthening ex 01/19/23 Nustep L 5 5 min Resisted gait 4 way 30 # each Leg Press 30# 3 sets 10 feet 3 way. Calf raises 30# 2 sets 10 Feet on ball bridge, KTC and  obliques Tband hip flex and clams 2 sets 10 Tband SLR with abd 10 x PROM RT LE- minimal tightness HS ,IT and piriformis Prone hip IR/ER pain and tightness DSTW to RT piriformis with and without rotation Ionto #1 to RT piriformis  01/16/23 Education   PATIENT EDUCATION:  Education details: POC Person educated: Patient Education method: Explanation Education comprehension: verbalized understanding  HOME EXERCISE PROGRAM: Has some exercises including figure 4, glut stretch, SLR with strap, bridge, clamshells, and a few others. Access Code: [email or web address] URL: [email or web address] Date: 01/22/2023 Prepared by: Caralee Ates  Exercises - Figure 4 Bridge  - 1 x daily - 3 x weekly - 3 sets - 10 reps - Sidelying Hip Abduction  - 1 x daily - 3 x weekly - 3 sets - 10 reps - Seated Hip Abduction with Resistance  - 1 x daily - 7 x weekly - 3 sets - 10 reps ASSESSMENT:  CLINICAL IMPRESSION: Pt reports her vacation went well, although spent at home. She feels ready to try to work on  her own, but would like an expanded HEP to guide her. Updated her program. She will trial the new exercises and report at next visit if she feels confident to D/C and continue on her own.  IMPAIRMENTS: Abnormal gait, decreased activity tolerance, decreased mobility, difficulty walking, decreased ROM, decreased strength, impaired flexibility, postural dysfunction, and pain.   ACTIVITY LIMITATIONS: lifting, bending, squatting, and locomotion level  PARTICIPATION LIMITATIONS: meal prep, cleaning, laundry, shopping, and community activity  PERSONAL FACTORS: Past/current experiences are also affecting patient's functional outcome.   REHAB POTENTIAL: Good  CLINICAL DECISION MAKING: Stable/uncomplicated  EVALUATION COMPLEXITY: Low  GOALS: Goals reviewed with patient? Yes  SHORT TERM GOALS: Target date: 02/02/23  I with initial HEP Baseline: Goal status: 01/19/23 MET  LONG TERM GOALS: Target date:  03/27/23  I with final HEP Baseline:  Goal status: 01/30/23 evolving  02/09/23 MET  2.  Patient will improve her FOTO score to at least 63 Baseline: 51.9 Goal status: INITIAL  3.  Patient will be able to climb a flight of steps, up and down, with U handrail, MI, no pain in R hip Baseline: Modified climbing due to pain in R hip Goal status: 01/30/23 progressing  02/09/23 MET  4.  Patient will be able to return to her normal walking routine with no pain in R hip, normalized gait pattern Baseline: Reports severely restricted distances due to pain. Shortened step length. Goal status: INITIAL  01/30/23, Progressing 02/20/23  5.  Patient will be able to get down to the floor and back up in order to return to her normal gardening activities Baseline: Modified Goal status: INITIAL  02/09/23 progressing  PLAN:  PT FREQUENCY: 1-2x/week  PT DURATION: 10 weeks  PLANNED INTERVENTIONS: Therapeutic exercises, Therapeutic activity, Neuromuscular re-education, Balance training, Gait training, Patient/Family education, Self Care, Joint mobilization, Dry Needling, Electrical stimulation, Cryotherapy, Moist heat, Ionotophoresis 4mg /ml Dexamethasone, and Manual therapy.  PLAN FOR NEXT SESSION: 02/20/23-Assess HEP. Education on floor<> stand transfers.  Patient Details  Name: Sundra Morgenthaler MRN: [id number] Date of Birth: 12-03-1950 Referring Provider:  Judi Saa, DO   Patient Details  Name: Tenequa Constance MRN: [id number] Date of Birth: [birth year] Referring Provider:  Judi Saa, DO  Encounter Date: 02/20/2023   Iona Beard, PT 02/20/2023, 12:16 PM  [facility] at [facility] [address] [facility] [address] [place], [place], [address] Phone: [phone]   Fax:  [phone]

## 2023-02-23 ENCOUNTER — Ambulatory Visit: Payer: Medicare HMO | Attending: Family Medicine | Admitting: Physical Therapy

## 2023-02-23 DIAGNOSIS — M6281 Muscle weakness (generalized): Secondary | ICD-10-CM | POA: Insufficient documentation

## 2023-02-23 DIAGNOSIS — M25551 Pain in right hip: Secondary | ICD-10-CM

## 2023-02-23 NOTE — Therapy (Signed)
OUTPATIENT PHYSICAL THERAPY THORACOLUMBAR TREATMENT  PHYSICAL THERAPY DISCHARGE SUMMARY  Visits from Start of Care: 10  Current functional level related to goals / functional outcomes: Goals met   Remaining deficits: N/A   Education / Equipment: HEP   Patient agrees to discharge. Patient goals were met. Patient is being discharged due to meeting the stated rehab goals.   Patient Name: Taylor Anderson MRN: [id number] DOB:Sep 08, 1950, 72 y.o., female Today's Date: 02/23/2023  END OF SESSION:  PT End of Session - 02/23/23 0836     Visit Number 10    Date for PT Re-Evaluation 03/27/23    PT Start Time 0837    PT Stop Time 0920    PT Time Calculation (min) 43 min             Past Medical History:  Diagnosis Date   Back pain    Enlarged thyroid 2012   2 nodules, biopsy showing goiter   Migraines    between 1980-1983   Osteopenia    Pleurisy 1986   Pneumonia    hx of   Shingles    Past Surgical History:  Procedure Laterality Date   COLONOSCOPY W/ POLYPECTOMY  2002   DILATION AND CURETTAGE OF UTERUS  1993   TONSILLECTOMY AND ADENOIDECTOMY  1959   TUBAL LIGATION  1993   Patient Active Problem List   Diagnosis Date Noted   Right hip pain 11/09/2022   Osteopenia 09/01/2022   Dermatochalasis of both upper eyelids 12/28/2017   Adie's tonic pupil, 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   Astigmatism with presbyopia, bilateral 12/25/2016   Family history of glaucoma 12/25/2016   Nuclear sclerotic cataract of both eyes 12/25/2016   Posterior vitreous detachment of both eyes 12/25/2016   Chronic cough 01/03/2011    PCP: Irena Reichmann, DO  REFERRING PROVIDER: Judi Saa, DO   REFERRING DIAG:  [phone] (ICD-10-CM) - Right hip pain  M54.50 (ICD-10-CM) - Lumbar spine pain    Rationale for Evaluation and Treatment: Rehabilitation  THERAPY DIAG:  Pain in right hip  Muscle weakness (generalized)  ONSET DATE: 12/27/22  SUBJECTIVE:                                                                                                                                                                                            SUBJECTIVE STATEMENT: doing great, doing HEP PERTINENT HISTORY:  Per referring provider note Back exam shows does have some loss of lordosis noted.  Some tenderness to palpation in the paraspinal musculature but very minimal.   More on the gluteal area than anywhere else.  Tightness with FABER test.  Negative straight leg  test  Received injection for similar pain on 11/08/22 along with HEP. PAIN:  Are you having pain? Yes: NPRS scale: 1/10 Pain location: R glut pain, can work its way down to my knee  Pain description: aching Aggravating factors: Stair climbing, sitting for a period of time. Relieving factors: Advil,  dry needling   PRECAUTIONS: None  WEIGHT BEARING RESTRICTIONS: No  FALLS:  Has patient fallen in last 6 months? No  LIVING ENVIRONMENT: Lives with: lives with their family Lives in: House/apartment Stairs: Yes: Internal: 14 steps; on left going up and External: 2 steps; bilateral but cannot reach both Has following equipment at home: None  OCCUPATION: Retired, very limited walking- slower and shorter strides  PLOF: Independent  PATIENT GOALS: Return to her normal daily activities and normal walking routine without pain.  NEXT MD VISIT: about 2 months  OBJECTIVE:   DIAGNOSTIC FINDINGS:  11/11/22 IMPRESSION: 1. Mild-to-moderate multilevel degenerative disc and endplate changes, worst at L4-5. 2. Grade 1 anterolisthesis of L4 on L5.  PATIENT SURVEYS:  FOTO 51.9  SCREENING FOR RED FLAGS: Bowel or bladder incontinence: No Spinal tumors: No Cauda equina syndrome: No Compression fracture: No Abdominal aneurysm: No  COGNITION: Overall cognitive status: Within functional limits for tasks assessed     SENSATION: Chronic R toe numbness, no changes  MUSCLE LENGTH: Hamstrings: Right 65 deg; Left 70 deg Thomas test: WFL  B  POSTURE: rounded shoulders, decreased lumbar lordosis, and decreased thoracic kyphosis  PALPATION: TTP over B piriformis, R > L  LUMBAR ROM: WFL, noted some tightness in flex and B rotation,but no pain.  LOWER EXTREMITY ROM:   Limited in R hip flex, ER, IR, otherwise WFL B   LOWER EXTREMITY MMT:  B hips 4-/5 in ext, 4/5 Abd, otherwise 5/5   LUMBAR SPECIAL TESTS:  Straight leg raise test: Negative, Slump test: Negative, Single leg stance test: Positive, and FABER test: Positive  GAIT: Distance walked: In clinic distances Assistive device utilized: None Level of assistance: Complete Independence Comments: Antalgic gait on R, mild, increase lateral hip excursion.  TODAY'S TREATMENT:                                                                                                                              DATE:   02/23/23 Nustep L5 6 inch step up on airex 10 x fwd with opp leg ext and then 10 x laterally with opp leg abd BIL Resisted Walking 5 x 4 way 40# Leg Press 40 # feet 3 way 12 x each 6# dead lift 2 sets 10 BOSU squats 10 x HS Curl 35# 2 sets 10 LAQ 10# 2 sets 10 Wall slides 10 x      02/20/23 NuStep L5 x 6 minutes Dynamic stretching- butt kicks, march, march with knee ext, HS stretch, hip mobility Standing shoulder ext, rows, ER Wall walks with resistance Heel raises on step Knee flex, 25#, 2 x 10, BLE Knee ext  10#, BLE, 2 x 10  02/09/23 Nustep L 5 10# hip cable pulleys 4 way BIL 12x each 30# resisted gait with step up fwd and laterally 5 x each 30# SL leg press 12 x each 40# leg press BIL with 12 x with IR and 12 x toes ER Bridge with ball squeeze 12 x Bridge with Gtband clams SLR with ABD red tband tband 2 sets 10 SL bridge feet on ball 10 x each HS curl bridge with feet on ball 10 x  Open/close gate 2 sets 10 BIL    02/06/23 NuStep L5 x 7 minutes Standing on upside down BOSU for proprioceptive awareness. Static, lateral and ant/post  weight shifts, circles in each direction. Good excursion and control noted. Step ups onto Airex, driving opposite knee to 90 degrees, minimized UE support x 10 each leg Standing hip abd, ext with 3# weights, 5 x 5 each Standing hip flexor stretch, 2 x 15 sec each DN to R piriformis by Otelia Santee, PT Standing hip abductor stretch Figure 4 to stretch piriformis  02/01/23 NuStep L5 x 6 minutes Deep STM to R piriformis and gluts F/B stretch with pressure Supine stretch-figure 4, glut Self Deep tissue mobs with tennis ball Supine strength clamshells, bridge, bridge with band resistance, 10 each Seated HS stretch Mini squats, side to side step with G tband resistance.  01/30/23 DN RT piriformis by Octavio Graves PT Leg Press feet 3 way 10 x each. Calf raises 2 sets 15 BOSU squats, fwd step ups and lateral step ups 10 x each Green tband hip IR/ER 2 sets 10 STS with green tband at knees holding ABD 15 x TM side stepping 1.2 min each way. BIL pull TM OFF 20 x Dead lifts 5# 10 x Reviewed HEPs that pt brought  01/24/23 TherEx Nustep L4x6 minutes BLEs  Bridge + ABD into red TB x12 Sidelying hip ABD 0# x12 B Walking bridges x6 Brief round of deep MFR to R piriformis  Selfcare Discussed frequency of prior exercise programming from past PT and MDs in addition to resistance exercises given here, encouraged warmup before exercise such as walking   01/22/23:  Manual:  Trigger Point Dry-Needling  Treatment instructions: Expect mild to moderate muscle soreness. S/S of pneumothorax if dry needled over a lung field, and to seek immediate medical attention should they occur. Patient verbalized understanding of these instructions and education. Patient Consent Given: Yes Education handout provided: Yes Muscles treated: R gluteus medius, R gluteus minimus, R TFL, R piriformis Electrical stimulation performed: No Parameters: N/A Treatment response/outcome: Twitch Response Elicited and Palpable Increase  in Muscle Length   Deep pressure, cross fricotion massage R gluteals, piriformis Therex: Nustep LE's and Ue's, 6 min, level 5 Instructed in therex as described below, to begin strengthening ex 01/19/23 Nustep L 5 5 min Resisted gait 4 way 30 # each Leg Press 30# 3 sets 10 feet 3 way. Calf raises 30# 2 sets 10 Feet on ball bridge, KTC and obliques Tband hip flex and clams 2 sets 10 Tband SLR with abd 10 x PROM RT LE- minimal tightness HS ,IT and piriformis Prone hip IR/ER pain and tightness DSTW to RT piriformis with and without rotation Ionto #1 to RT piriformis  01/16/23 Education   PATIENT EDUCATION:  Education details: POC Person educated: Patient Education method: Explanation Education comprehension: verbalized understanding  HOME EXERCISE PROGRAM: Has some exercises including figure 4, glut stretch, SLR with strap, bridge, clamshells, and a few others. Access  Code: [email or web address] URL: [email or web address] Date: 01/22/2023 Prepared by: Caralee Ates  Exercises - Figure 4 Bridge  - 1 x daily - 3 x weekly - 3 sets - 10 reps - Sidelying Hip Abduction  - 1 x daily - 3 x weekly - 3 sets - 10 reps - Seated Hip Abduction with Resistance  - 1 x daily - 7 x weekly - 3 sets - 10 reps ASSESSMENT:  CLINICAL IMPRESSION:  All goals met. Reviewed HEP and pt feels good to continue at home. D/C   IMPAIRMENTS: Abnormal gait, decreased activity tolerance, decreased mobility, difficulty walking, decreased ROM, decreased strength, impaired flexibility, postural dysfunction, and pain.   ACTIVITY LIMITATIONS: lifting, bending, squatting, and locomotion level  PARTICIPATION LIMITATIONS: meal prep, cleaning, laundry, shopping, and community activity  PERSONAL FACTORS: Past/current experiences are also affecting patient's functional outcome.   REHAB POTENTIAL: Good  CLINICAL DECISION MAKING: Stable/uncomplicated  EVALUATION COMPLEXITY: Low  GOALS: Goals reviewed with  patient? Yes  SHORT TERM GOALS: Target date: 02/02/23  I with initial HEP Baseline: Goal status: 01/19/23 MET  LONG TERM GOALS: Target date: 03/27/23  I with final HEP Baseline:  Goal status: 01/30/23 evolving  02/09/23 MET  2.  Patient will improve her FOTO score to at least 63 Baseline: 51.9 Goal status: INITIAL  MET 02/23/23  3.  Patient will be able to climb a flight of steps, up and down, with U handrail, MI, no pain in R hip Baseline: Modified climbing due to pain in R hip Goal status: 01/30/23 progressing  02/09/23 MET  4.  Patient will be able to return to her normal walking routine with no pain in R hip, normalized gait pattern Baseline: Reports severely restricted distances due to pain. Shortened step length. Goal status: INITIAL  01/30/23, Progressing 02/20/23  02/23/23 MET  5.  Patient will be able to get down to the floor and back up in order to return to her normal gardening activities Baseline: Modified Goal status: INITIAL  02/09/23 progressing  MET 02/23/23  PLAN:  PT FREQUENCY: 1-2x/week  PT DURATION: 10 weeks  PLANNED INTERVENTIONS: Therapeutic exercises, Therapeutic activity, Neuromuscular re-education, Balance training, Gait training, Patient/Family education, Self Care, Joint mobilization, Dry Needling, Electrical stimulation, Cryotherapy, Moist heat, Ionotophoresis 4mg /ml Dexamethasone, and Manual therapy.  PLAN FOR NEXT SESSION: D/C  PHYSICAL THERAPY DISCHARGE SUMMARY   Patient agrees to discharge. Patient goals were met. Patient is being discharged due to meeting the stated rehab goals.   Patient Details  Name: Taylor Anderson MRN: [id number] Date of Birth: October 27, 1950 Referring Provider:  Judi Saa, DO  Encounter Date: 02/23/2023  Oley Balm DPT 02/23/23 9:23 AM   Suanne Marker, PTA 02/23/2023, 8:37 AM  [facility] [address] [facility] [place], [place], [address] Phone: [phone]   Fax:   [phone] [address] [place] [place], [place], [address] Phone: [phone]   Fax:  [phone]

## 2023-03-21 NOTE — Progress Notes (Signed)
[name] [address] [place] [address] Phone: [phone] Subjective:   Taylor Anderson, am serving as a scribe for Dr. Antoine Primas.  I'm seeing this patient by the request  of:  Irena Reichmann, DO  CC: right hip pain follow up   [id number]  12/27/2022 Right hip seems to more of a gluteal injury but does have anti-inflammatories. Discussed 600 mg of ibuprofen 3 times a day for 3 days to see if this would be significantly beneficial. Patient is already is responding to even lighter doses than that. Encourage patient to try to increase the frequency of the exercises and the hip abductor strengthening. Discussed which activities to do and which ones to avoid. Follow-up with me again in 6 to 8 weeks otherwise.   Update 03/28/2023 Taylor Anderson is a 72 y.o. female coming in with complaint of R hip pain.  Has some potential radiculopathy.  Started on formal physical therapy.  Patient states that she was doing well but went on vacation and was not working out. Since being back her hip and back pain has improved. Dry needling in PT was very helpful. Pain increases with sitting for prolonged periods.       Past Medical History:  Diagnosis Date   Back pain    Enlarged thyroid 2012   2 nodules, biopsy showing goiter   Migraines    between 1980-1983   Osteopenia    Pleurisy 1986   Pneumonia    hx of   Shingles    Past Surgical History:  Procedure Laterality Date   COLONOSCOPY W/ POLYPECTOMY  2002   DILATION AND CURETTAGE OF UTERUS  1993   TONSILLECTOMY AND ADENOIDECTOMY  1959   TUBAL LIGATION  1993   Social History   Socioeconomic History   Marital status: Married    Spouse name: Not on file   Number of children: Not on file   Years of education: Not on file   Highest education level: Not on file  Occupational History   Occupation: Former Runner, broadcasting/film/video  Tobacco Use   Smoking status: Never   Smokeless tobacco: Never  Vaping Use   Vaping  status: Never Used  Substance and Sexual Activity   Alcohol use: No   Drug use: No   Sexual activity: Yes    Partners: Male    Birth control/protection: Surgical    Comment: btl  Other Topics Concern   Not on file  Social History Narrative   Not on file   Social Determinants of Health   Financial Resource Strain: Not on file  Food Insecurity: Not on file  Transportation Needs: Not on file  Physical Activity: Not on file  Stress: Not on file  Social Connections: Not on file   Allergies  Allergen Reactions   Gabapentin     Chest tightness, sleepiness   Sulfa Antibiotics     rash   Family History  Problem Relation Age of Onset   Asthma Maternal Aunt    Heart disease Maternal Grandfather    Cancer Paternal Grandfather        colon   Diabetes Maternal Grandmother    Heart disease Father        bypass   Breast cancer Neg Hx       Current Outpatient Medications (Respiratory):    fexofenadine (ALLEGRA) 180 MG tablet, Take 180 mg by mouth daily.    Current Outpatient Medications (Other):    Calcium Citrate-Vitamin D (CALCIUM +  D PO), 1 tablet with food   Cholecalciferol (VITAMIN D PO), Take 4,000 Int'l Units by mouth daily.    Multiple Vitamins-Minerals (MULTIVITAMIN PO), Take by mouth daily.   TURMERIC CURCUMIN PO, Take 500 mg by mouth daily.      Objective  Blood pressure 108/80, pulse 73, height 5\' 7"  (1.702 m), weight 161 lb (73 kg), last menstrual period 11/17/2005, SpO2 97%.   General: No apparent distress alert and oriented x3 mood and affect normal, dressed appropriately.  HEENT: Pupils equal, extraocular movements intact  Respiratory: Patient's speak in full sentences and does not appear short of breath  Cardiovascular: No lower extremity edema, non tender, no erythema  Right hip exam has good range of motion.  Still some tenderness to palpation over the greater trochanteric area.    Impression and Recommendations:     The above documentation has  been reviewed and is accurate and complete Judi Saa, DO

## 2023-03-28 ENCOUNTER — Encounter: Payer: Self-pay | Admitting: Family Medicine

## 2023-03-28 ENCOUNTER — Ambulatory Visit: Payer: Medicare HMO | Admitting: Family Medicine

## 2023-03-28 VITALS — BP 108/80 | HR 73 | Ht 67.0 in | Wt 161.0 lb

## 2023-03-28 DIAGNOSIS — M25551 Pain in right hip: Secondary | ICD-10-CM | POA: Diagnosis not present

## 2023-03-28 NOTE — Assessment & Plan Note (Addendum)
Patient is doing much better overall.  Has had 1 injection previously.  He is already feeling 85% better.  Did do a lot of traveling that did take patient outside some of the routine patient was making impression with initially.  Discussed which activities would be more beneficial otherwise.  Follow-up with me again in 6 to 8 weeks Patient did do very well though with physical therapy and dry needling.  Will put in an urgent referral in case patient would like to do another evaluation.

## 2023-03-28 NOTE — Patient Instructions (Signed)
PT referral  Stay active See me again in 3 months

## 2023-06-13 ENCOUNTER — Ambulatory Visit: Payer: Medicare HMO | Attending: Family Medicine | Admitting: Physical Therapy

## 2023-06-13 ENCOUNTER — Encounter: Payer: Self-pay | Admitting: Physical Therapy

## 2023-06-13 DIAGNOSIS — M6281 Muscle weakness (generalized): Secondary | ICD-10-CM | POA: Insufficient documentation

## 2023-06-13 DIAGNOSIS — R262 Difficulty in walking, not elsewhere classified: Secondary | ICD-10-CM | POA: Diagnosis present

## 2023-06-13 DIAGNOSIS — R600 Localized edema: Secondary | ICD-10-CM | POA: Insufficient documentation

## 2023-06-13 DIAGNOSIS — M25551 Pain in right hip: Secondary | ICD-10-CM | POA: Diagnosis present

## 2023-06-13 NOTE — Therapy (Signed)
OUTPATIENT PHYSICAL THERAPY LOWER EXTREMITY EVALUATION   Patient Name: Taylor Anderson MRN: [id number] DOB:July 09, 1951, 72 y.o., female Today's Date: 06/13/2023  END OF SESSION:  PT End of Session - 06/13/23 1611     Visit Number 1    Date for PT Re-Evaluation 08/22/23    PT Start Time 1315    PT Stop Time 1350    PT Time Calculation (min) 35 min             Past Medical History:  Diagnosis Date   Back pain    Enlarged thyroid 2012   2 nodules, biopsy showing goiter   Migraines    between 1980-1983   Osteopenia    Pleurisy 1986   Pneumonia    hx of   Shingles    Past Surgical History:  Procedure Laterality Date   COLONOSCOPY W/ POLYPECTOMY  2002   DILATION AND CURETTAGE OF UTERUS  1993   TONSILLECTOMY AND ADENOIDECTOMY  1959   TUBAL LIGATION  1993   Patient Active Problem List   Diagnosis Date Noted   Right hip pain 11/09/2022   Osteopenia 09/01/2022   Dermatochalasis of both upper eyelids 12/28/2017   Adie's tonic pupil, 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   Astigmatism with presbyopia, bilateral 12/25/2016   Family history of glaucoma 12/25/2016   Nuclear sclerotic cataract of both eyes 12/25/2016   Posterior vitreous detachment of both eyes 12/25/2016   Chronic cough 01/03/2011    PCP: Irena Reichmann, DO  REFERRING PROVIDER: Judi Saa, DO  REFERRING DIAG: [phone] (ICD-10-CM) - Right hip pain   THERAPY DIAG:  Pain in right hip  Difficulty in walking, not elsewhere classified  Localized edema  Muscle weakness (generalized)  Rationale for Evaluation and Treatment: Rehabilitation  ONSET DATE: 03/28/23  SUBJECTIVE:   SUBJECTIVE STATEMENT: Patient reports that after her last PT sessions she took a trip to [place]. Upon return her hip was very sore. She returned to the Dr. Who provided a PT referral. She tried to recover on her own, but the pain has not subsided so she wants to be sure that she is not missing anything. Climbing up or down steps is very difficult and  her stiffness returns after sitting for any period of time.  PERTINENT HISTORY: Per referring physician note on 03/28/23: Patient is doing much better overall. Has had 1 injection previously. He is already feeling 85% better. Did do a lot of traveling that did take patient outside some of the routine patient was making impression with initially. Discussed which activities would be more beneficial otherwise. Follow-up with me again in 6 to 8 weeks Patient did do very well though with physical therapy and dry needling.  Will put in an urgent referral in case patient would like to do another evaluation PAIN:  Are you having pain? Yes: NPRS scale: 7/10 Pain location: In R hip. She has chronic swelling and pain in R knee Pain description: aching, can be sharp when stepping up. Aggravating factors: Stairs and sitting Relieving factors: She feels she has adapted to the pain, modified her movement.  PRECAUTIONS: Fall  RED FLAGS: None   WEIGHT BEARING RESTRICTIONS: No  FALLS:  Has patient fallen in last 6 months? No  LIVING ENVIRONMENT: Lives with: lives with their family Lives in: House/apartment Stairs: Yes: Internal: 14 steps; on left going up and External: 2 steps; bilateral but cannot reach both Has following equipment at home: None  OCCUPATION: Retired  PLOF: Independent  PATIENT GOALS: Patient would like to  get pain relief or at least understand her limitations and whether this even can be improved.  NEXT MD VISIT: In Dec  OBJECTIVE:  Note: Objective measures were completed at Evaluation unless otherwise noted.  DIAGNOSTIC FINDINGS:  11/11/22 IMPRESSION: 1. Mild-to-moderate multilevel degenerative disc and endplate changes, worst at L4-5. 2. Grade 1 anterolisthesis of L4 on L5.  PATIENT SURVEYS:  FOTO 55.9  COGNITION: Overall cognitive status: Within functional limits for tasks assessed     SENSATION: WFL  EDEMA:  Chronic swelling in R knee.  MUSCLE  LENGTH: Hamstrings: Right 80 deg; Left 90 deg Thomas test: Mildly impaired  POSTURE: rounded shoulders, forward head, and decreased lumbar lordosis  PALPATION: TTP R piriformis, greater trochanter, ITB  LOWER EXTREMITY ROM: WNL L. R hip ROM at least as mobile as L, except IR and add   LOWER EXTREMITY MMT: (4+)-5/5 throughout  LOWER EXTREMITY SPECIAL TESTS:  Hip special tests: Luisa Hart (FABER) test: positive , Ober's test: positive , and Hip scouring test: negative SLR test (-) B GAIT: Distance walked: In clinic distances Assistive device utilized: None Level of assistance: Complete Independence Comments: Patient reports that her pain is better today. She mostly feels it first thing in the morning, if she has been sitting a while, or when climbing steps.   TODAY'S TREATMENT:                                                                                                                              DATE:  06/13/23 Education  Ionto patch to R greater trochanter  PATIENT EDUCATION:  Education details: POC, review previous HEP Person educated: Patient Education method: Explanation Education comprehension: verbalized understanding  HOME EXERCISE PROGRAM: [id number] from previous therapy for hip pain and back pain, completed 02/23/23  ASSESSMENT:  CLINICAL IMPRESSION: Patient is a 72 y.o. who was seen today for physical therapy evaluation and treatment for recurrent R hip Pain. She reports that the pain was better after last PT encounter, but returned after a trip to [place]. She thinks it was due to the increased sitting and also the bed she slept in. She did not perform her exercises while in [place], but started back with Deep tissue mobs using tennis ball, stretching and strengthening upon return with some improvement, but she still has severe pain with trying to climb steps or after sitting. Her strength is pretty good, except possibly in SLS on R. Her ROM is mostly WFL, except R hip add  and IR. The painful area is much small, directly over piriformis. She was also tender directly over her greater trochanter and along ITB. SLR test was negative. Suspect some Bursitis, which patient did confirm that her Dr had suspected the same. Provided Ionto patch and plan to review her HEP, treat the painful, tight areas, see how she responds to patch.  OBJECTIVE IMPAIRMENTS: Abnormal gait, decreased activity tolerance, decreased balance, decreased coordination, difficulty walking, decreased ROM, decreased strength, impaired flexibility, and  pain.   ACTIVITY LIMITATIONS: bending, sitting, squatting, stairs, transfers, and locomotion level  PARTICIPATION LIMITATIONS: meal prep, cleaning, laundry, driving, shopping, and community activity  PERSONAL FACTORS: Past/current experiences are also affecting patient's functional outcome.   REHAB POTENTIAL: Good  CLINICAL DECISION MAKING: Stable/uncomplicated  EVALUATION COMPLEXITY: Low   GOALS: Goals reviewed with patient? Yes  SHORT TERM GOALS: Target date: 06/27/23 I with her previous HEP Baseline: Goal status: INITIAL  LONG TERM GOALS: Target date: 08/22/23  I with final, updated HEP Baseline:  Goal status: INITIAL  2.  Improve FOTO to at least 63 Baseline:  Goal status: INITIAL  3.  Patient will be able to return to her normal walking routine with R hip pain < 3/10 Baseline:  Goal status: INITIAL  4.  Patient will be able to climb steps, up/down using a single rail, step over step, with R hip pain <3/10 Baseline:  Goal status: INITIAL  5.  Patient will report pain in her R hip remains <3/10 throughout the day. Baseline:  Goal status: INITIAL  PLAN:  PT FREQUENCY: 2x/week  PT DURATION: 10 weeks  PLANNED INTERVENTIONS: 97110-Therapeutic exercises, 97530- Therapeutic activity, [id number]- Neuromuscular re-education, 97535- Self Care, 16109- Manual therapy, [phone]- Gait training, 97014- Electrical stimulation (unattended),  [phone]- Ionotophoresis 4mg /ml Dexamethasone, Patient/Family education, Balance training, Stair training, Dry Needling, Joint mobilization, Spinal mobilization, Cryotherapy, and Moist heat  PLAN FOR NEXT SESSION: Re-assess and update HEP, acute pain treatment for R hip   Iona Beard, DPT 06/13/2023, 4:16 PM

## 2023-06-18 ENCOUNTER — Ambulatory Visit: Payer: Medicare HMO | Admitting: Physical Therapy

## 2023-06-18 ENCOUNTER — Encounter: Payer: Self-pay | Admitting: Physical Therapy

## 2023-06-18 DIAGNOSIS — M25551 Pain in right hip: Secondary | ICD-10-CM | POA: Diagnosis not present

## 2023-06-18 DIAGNOSIS — R262 Difficulty in walking, not elsewhere classified: Secondary | ICD-10-CM

## 2023-06-18 DIAGNOSIS — R600 Localized edema: Secondary | ICD-10-CM

## 2023-06-18 DIAGNOSIS — M6281 Muscle weakness (generalized): Secondary | ICD-10-CM

## 2023-06-18 NOTE — Therapy (Signed)
OUTPATIENT PHYSICAL THERAPY LOWER EXTREMITY TREATMENT   Patient Name: Francina Bethea MRN: [id number] DOB:10-23-1950, 72 y.o., female Today's Date: 06/18/2023  END OF SESSION:  PT End of Session - 06/18/23 1144     Visit Number 2    Date for PT Re-Evaluation 08/22/23    PT Start Time 1145    PT Stop Time 1219   MHP not included in billing   PT Time Calculation (min) 34 min    Activity Tolerance Patient tolerated treatment well    Behavior During Therapy [facility] for tasks assessed/performed              Past Medical History:  Diagnosis Date   Back pain    Enlarged thyroid 2012   2 nodules, biopsy showing goiter   Migraines    between 1980-1983   Osteopenia    Pleurisy 1986   Pneumonia    hx of   Shingles    Past Surgical History:  Procedure Laterality Date   COLONOSCOPY W/ POLYPECTOMY  2002   DILATION AND CURETTAGE OF UTERUS  1993   TONSILLECTOMY AND ADENOIDECTOMY  1959   TUBAL LIGATION  1993   Patient Active Problem List   Diagnosis Date Noted   Right hip pain 11/09/2022   Osteopenia 09/01/2022   Dermatochalasis of both upper eyelids 12/28/2017   Adie's tonic pupil, 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   Astigmatism with presbyopia, bilateral 12/25/2016   Family history of glaucoma 12/25/2016   Nuclear sclerotic cataract of both eyes 12/25/2016   Posterior vitreous detachment of both eyes 12/25/2016   Chronic cough 01/03/2011    PCP: Irena Reichmann, DO  REFERRING PROVIDER: Judi Saa, DO  REFERRING DIAG: [phone] (ICD-10-CM) - Right hip pain   THERAPY DIAG:  Pain in right hip  Difficulty in walking, not elsewhere classified  Localized edema  Muscle weakness (generalized)  Rationale for Evaluation and Treatment: Rehabilitation  ONSET DATE: 03/28/23  SUBJECTIVE:   SUBJECTIVE STATEMENT:  My piriformis is out of whack, I was here before and it was good. I went away and came back barely being able to walk after the trip, I tried to get it better on my own but it  wasn't getting better. I was cleaning my whole house for thanksgiving, I keep doing things and it keeps hurting. Its better than it was when I first came in, it was more pointed that first time now it is less specific. Didn't notice a lot of difference with the patch. Advil fixes it for a short period of time but I'm looking for long term solutions.     EVAL: Patient reports that after her last PT sessions she took a trip to [place]. Upon return her hip was very sore. She returned to the Dr. Who provided a PT referral. She tried to recover on her own, but the pain has not subsided so she wants to be sure that she is not missing anything. Climbing up or down steps is very difficult and her stiffness returns after sitting for any period of time.  PERTINENT HISTORY: Per referring physician note on 03/28/23: Patient is doing much better overall. Has had 1 injection previously. He is already feeling 85% better. Did do a lot of traveling that did take patient outside some of the routine patient was making impression with initially. Discussed which activities would be more beneficial otherwise. Follow-up with me again in 6 to 8 weeks Patient did do very well though with physical therapy and dry needling.  Will put  in an urgent referral in case patient would like to do another evaluation PAIN:  Are you having pain? No 0/10 right now, but in the evenings if I'm sitting for awhile I have to stand and wait and then go (only sitting 45 minutes in one go, rarely longer than that)  PRECAUTIONS: Fall  RED FLAGS: None   WEIGHT BEARING RESTRICTIONS: No  FALLS:  Has patient fallen in last 6 months? No  LIVING ENVIRONMENT: Lives with: lives with their family Lives in: House/apartment Stairs: Yes: Internal: 14 steps; on left going up and External: 2 steps; bilateral but cannot reach both Has following equipment at home: None  OCCUPATION: Retired  PLOF: Independent  PATIENT GOALS: Patient would like to get  pain relief or at least understand her limitations and whether this even can be improved.  NEXT MD VISIT: In Dec  OBJECTIVE:  Note: Objective measures were completed at Evaluation unless otherwise noted.  DIAGNOSTIC FINDINGS:  11/11/22 IMPRESSION: 1. Mild-to-moderate multilevel degenerative disc and endplate changes, worst at L4-5. 2. Grade 1 anterolisthesis of L4 on L5.  PATIENT SURVEYS:  FOTO 55.9  COGNITION: Overall cognitive status: Within functional limits for tasks assessed     SENSATION: WFL  EDEMA:  Chronic swelling in R knee.  MUSCLE LENGTH: Hamstrings: Right 80 deg; Left 90 deg Thomas test: Mildly impaired  POSTURE: rounded shoulders, forward head, and decreased lumbar lordosis  PALPATION: TTP R piriformis, greater trochanter, ITB  LOWER EXTREMITY ROM: WNL L. R hip ROM at least as mobile as L, except IR and add   LOWER EXTREMITY MMT: (4+)-5/5 throughout  LOWER EXTREMITY SPECIAL TESTS:  Hip special tests: Luisa Hart (FABER) test: positive , Ober's test: positive , and Hip scouring test: negative SLR test (-) B GAIT: Distance walked: In clinic distances Assistive device utilized: None Level of assistance: Complete Independence Comments: Patient reports that her pain is better today. She mostly feels it first thing in the morning, if she has been sitting a while, or when climbing steps.   TODAY'S TREATMENT:                                                                                                                              DATE:   06/18/23  TherEx  SciFit L5 x6 minutes for w/u and tissue perfusion Lateral R thigh tight and tender, very tender along R SI joint, slight LLD with R LE 1/8/ inch longer  Self SI MET correction 6x6 seconds   Manual  Percussion gun R lateral thigh and glutes x10 minutes     MHP x8 minutes R hip sidelying prior to percussion gun     06/13/23 Education  Ionto patch to R greater trochanter  PATIENT  EDUCATION:  Education details: POC, review previous HEP Person educated: Patient Education method: Explanation Education comprehension: verbalized understanding  HOME EXERCISE PROGRAM: [id number] from previous therapy for hip pain and back pain, completed 02/23/23  ASSESSMENT:  CLINICAL IMPRESSION:  Pt arrives today doing OK, not having pain this morning but still having issues with this same pain if she is sitting for extended periods. She does not endorse any symptoms that might be related to her hx of back pain. Examined her personal workout routine- looks ok, I do question if she has some SI involvement playing a role. Trailed percussion gun today, she has one at home and can use this if it is successful in managing her pain. Deferred ionto today, she did not notice a big difference from the patch.      EVAL:Patient is a 72 y.o. who was seen today for physical therapy evaluation and treatment for recurrent R hip Pain. She reports that the pain was better after last PT encounter, but returned after a trip to [place]. She thinks it was due to the increased sitting and also the bed she slept in. She did not perform her exercises while in [place], but started back with Deep tissue mobs using tennis ball, stretching and strengthening upon return with some improvement, but she still has severe pain with trying to climb steps or after sitting. Her strength is pretty good, except possibly in SLS on R. Her ROM is mostly WFL, except R hip add and IR. The painful area is much small, directly over piriformis. She was also tender directly over her greater trochanter and along ITB. SLR test was negative. Suspect some Bursitis, which patient did confirm that her Dr had suspected the same. Provided Ionto patch and plan to review her HEP, treat the painful, tight areas, see how she responds to patch.  OBJECTIVE IMPAIRMENTS: Abnormal gait, decreased activity tolerance, decreased balance, decreased coordination,  difficulty walking, decreased ROM, decreased strength, impaired flexibility, and pain.   ACTIVITY LIMITATIONS: bending, sitting, squatting, stairs, transfers, and locomotion level  PARTICIPATION LIMITATIONS: meal prep, cleaning, laundry, driving, shopping, and community activity  PERSONAL FACTORS: Past/current experiences are also affecting patient's functional outcome.   REHAB POTENTIAL: Good  CLINICAL DECISION MAKING: Stable/uncomplicated  EVALUATION COMPLEXITY: Low   GOALS: Goals reviewed with patient? Yes  SHORT TERM GOALS: Target date: 06/27/23 I with her previous HEP Baseline: Goal status: INITIAL  LONG TERM GOALS: Target date: 08/22/23  I with final, updated HEP Baseline:  Goal status: INITIAL  2.  Improve FOTO to at least 63 Baseline:  Goal status: INITIAL  3.  Patient will be able to return to her normal walking routine with R hip pain < 3/10 Baseline:  Goal status: INITIAL  4.  Patient will be able to climb steps, up/down using a single rail, step over step, with R hip pain <3/10 Baseline:  Goal status: INITIAL  5.  Patient will report pain in her R hip remains <3/10 throughout the day. Baseline:  Goal status: INITIAL  PLAN:  PT FREQUENCY: 2x/week  PT DURATION: 10 weeks  PLANNED INTERVENTIONS: 97110-Therapeutic exercises, 97530- Therapeutic activity, [id number]- Neuromuscular re-education, 97535- Self Care, 09811- Manual therapy, [id number]- Gait training, 97014- Electrical stimulation (unattended), [phone]- Ionotophoresis 4mg /ml Dexamethasone, Patient/Family education, Balance training, Stair training, Dry Needling, Joint mobilization, Spinal mobilization, Cryotherapy, and Moist heat  PLAN FOR NEXT SESSION: Re-assess and update HEP, acute pain treatment for R hip. How did she feel after percussion gun and sitting long periods after last tx?   Nedra Hai, PT, DPT 06/18/23 12:28 PM

## 2023-06-26 ENCOUNTER — Encounter: Payer: Self-pay | Admitting: Physical Therapy

## 2023-06-26 ENCOUNTER — Ambulatory Visit: Payer: Medicare HMO | Attending: Family Medicine | Admitting: Physical Therapy

## 2023-06-26 DIAGNOSIS — R262 Difficulty in walking, not elsewhere classified: Secondary | ICD-10-CM | POA: Insufficient documentation

## 2023-06-26 DIAGNOSIS — M6281 Muscle weakness (generalized): Secondary | ICD-10-CM | POA: Diagnosis present

## 2023-06-26 DIAGNOSIS — R600 Localized edema: Secondary | ICD-10-CM | POA: Insufficient documentation

## 2023-06-26 DIAGNOSIS — M25551 Pain in right hip: Secondary | ICD-10-CM | POA: Insufficient documentation

## 2023-06-26 NOTE — Therapy (Signed)
OUTPATIENT PHYSICAL THERAPY LOWER EXTREMITY TREATMENT   Patient Name: Taylor Anderson MRN: [id number] DOB:[birth year], 72 y.o., female Today's Date: 06/26/2023  END OF SESSION:  PT End of Session - 06/26/23 1022     Visit Number 3    Date for PT Re-Evaluation 08/22/23    PT Start Time 1026   MHP not included in billing   PT Stop Time 1058    PT Time Calculation (min) 32 min    Activity Tolerance Patient tolerated treatment well    Behavior During Therapy [facility] for tasks assessed/performed               Past Medical History:  Diagnosis Date   Back pain    Enlarged thyroid 2012   2 nodules, biopsy showing goiter   Migraines    between 1980-1983   Osteopenia    Pleurisy 1986   Pneumonia    hx of   Shingles    Past Surgical History:  Procedure Laterality Date   COLONOSCOPY W/ POLYPECTOMY  2002   DILATION AND CURETTAGE OF UTERUS  1993   TONSILLECTOMY AND ADENOIDECTOMY  1959   TUBAL LIGATION  1993   Patient Active Problem List   Diagnosis Date Noted   Right hip pain 11/09/2022   Osteopenia 09/01/2022   Dermatochalasis of both upper eyelids 12/28/2017   Adie's tonic pupil, 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   Astigmatism with presbyopia, bilateral 12/25/2016   Family history of glaucoma 12/25/2016   Nuclear sclerotic cataract of both eyes 12/25/2016   Posterior vitreous detachment of both eyes 12/25/2016   Chronic cough 01/03/2011    PCP: Irena Reichmann, DO  REFERRING PROVIDER: Judi Saa, DO  REFERRING DIAG: [phone] (ICD-10-CM) - Right hip pain   THERAPY DIAG:  Pain in right hip  Difficulty in walking, not elsewhere classified  Localized edema  Muscle weakness (generalized)  Rationale for Evaluation and Treatment: Rehabilitation  ONSET DATE: 03/28/23  SUBJECTIVE:   SUBJECTIVE STATEMENT:  The day you did the gun was great, but still having issues with standing up and steps. Haven't gotten to the gun like you recommended. Things are about the same.     EVAL:  Patient reports that after her last PT sessions she took a trip to [place]. Upon return her hip was very sore. She returned to the Dr. Who provided a PT referral. She tried to recover on her own, but the pain has not subsided so she wants to be sure that she is not missing anything. Climbing up or down steps is very difficult and her stiffness returns after sitting for any period of time.  PERTINENT HISTORY: Per referring physician note on 03/28/23: Patient is doing much better overall. Has had 1 injection previously. He is already feeling 85% better. Did do a lot of traveling that did take patient outside some of the routine patient was making impression with initially. Discussed which activities would be more beneficial otherwise. Follow-up with me again in 6 to 8 weeks Patient did do very well though with physical therapy and dry needling.  Will put in an urgent referral in case patient would like to do another evaluation PAIN:  Are you having pain? Yes: NPRS scale: 3/10 Pain location: R hip  Pain description: sore  Aggravating factors: stairs and getting up after sitting for a long time  Relieving factors: heat/massage gun    PRECAUTIONS: Fall  RED FLAGS: None   WEIGHT BEARING RESTRICTIONS: No  FALLS:  Has patient fallen in last 6 months?  No  LIVING ENVIRONMENT: Lives with: lives with their family Lives in: House/apartment Stairs: Yes: Internal: 14 steps; on left going up and External: 2 steps; bilateral but cannot reach both Has following equipment at home: None  OCCUPATION: Retired  PLOF: Independent  PATIENT GOALS: Patient would like to get pain relief or at least understand her limitations and whether this even can be improved.  NEXT MD VISIT: In Dec  OBJECTIVE:  Note: Objective measures were completed at Evaluation unless otherwise noted.  DIAGNOSTIC FINDINGS:  11/11/22 IMPRESSION: 1. Mild-to-moderate multilevel degenerative disc and endplate changes, worst at L4-5. 2.  Grade 1 anterolisthesis of L4 on L5.  PATIENT SURVEYS:  FOTO 55.9  COGNITION: Overall cognitive status: Within functional limits for tasks assessed     SENSATION: WFL  EDEMA:  Chronic swelling in R knee.  MUSCLE LENGTH: Hamstrings: Right 80 deg; Left 90 deg Thomas test: Mildly impaired  POSTURE: rounded shoulders, forward head, and decreased lumbar lordosis  PALPATION: TTP R piriformis, greater trochanter, ITB  LOWER EXTREMITY ROM: WNL L. R hip ROM at least as mobile as L, except IR and add   LOWER EXTREMITY MMT: (4+)-5/5 throughout  LOWER EXTREMITY SPECIAL TESTS:  Hip special tests: Luisa Hart (FABER) test: positive , Ober's test: positive , and Hip scouring test: negative SLR test (-) B GAIT: Distance walked: In clinic distances Assistive device utilized: None Level of assistance: Complete Independence Comments: Patient reports that her pain is better today. She mostly feels it first thing in the morning, if she has been sitting a while, or when climbing steps.   TODAY'S TREATMENT:                                                                                                                              DATE:   06/26/23  TherEx  Nustep L5 x8 minutes BLEs only Figure 4 stretch 3x30 seconds R LE  R LE HS stretch 3x30 seconds supine R LE single leg bridges x10    Manual  Massage gun to R hamstrings, lateral thigh, R glute and piriformis x10 minutes   MHP R hip in sidelying x10 minutes prior to massage gun work,  not included in billing     06/18/23  TherEx  SciFit L5 x6 minutes for w/u and tissue perfusion Lateral R thigh tight and tender, very tender along R SI joint, slight LLD with R LE 1/8/ inch longer  Self SI MET correction 6x6 seconds   Manual  Percussion gun R lateral thigh and glutes x10 minutes     MHP x8 minutes R hip sidelying prior to percussion gun     06/13/23 Education  Ionto patch to R greater trochanter  PATIENT  EDUCATION:  Education details: POC, review previous HEP Person educated: Patient Education method: Explanation Education comprehension: verbalized understanding  HOME EXERCISE PROGRAM: [id number] from previous therapy for hip pain and back pain, completed 02/23/23  ASSESSMENT:  CLINICAL IMPRESSION:   Pt  arrives today doing OK, was not able to keep up with massage gun/heat. We continued with these interventions as they were very successful last time, encouraged regular use of these interventions at home as well so she has access. Rechecked some MMT as well with Hip ABD strength 4+/5.but extensors functionally weak Will continue efforts. Sees the MD tomorrow, curious to know his thoughts.      EVAL:Patient is a 72 y.o. who was seen today for physical therapy evaluation and treatment for recurrent R hip Pain. She reports that the pain was better after last PT encounter, but returned after a trip to [place]. She thinks it was due to the increased sitting and also the bed she slept in. She did not perform her exercises while in [place], but started back with Deep tissue mobs using tennis ball, stretching and strengthening upon return with some improvement, but she still has severe pain with trying to climb steps or after sitting. Her strength is pretty good, except possibly in SLS on R. Her ROM is mostly WFL, except R hip add and IR. The painful area is much small, directly over piriformis. She was also tender directly over her greater trochanter and along ITB. SLR test was negative. Suspect some Bursitis, which patient did confirm that her Dr had suspected the same. Provided Ionto patch and plan to review her HEP, treat the painful, tight areas, see how she responds to patch.  OBJECTIVE IMPAIRMENTS: Abnormal gait, decreased activity tolerance, decreased balance, decreased coordination, difficulty walking, decreased ROM, decreased strength, impaired flexibility, and pain.   ACTIVITY LIMITATIONS: bending,  sitting, squatting, stairs, transfers, and locomotion level  PARTICIPATION LIMITATIONS: meal prep, cleaning, laundry, driving, shopping, and community activity  PERSONAL FACTORS: Past/current experiences are also affecting patient's functional outcome.   REHAB POTENTIAL: Good  CLINICAL DECISION MAKING: Stable/uncomplicated  EVALUATION COMPLEXITY: Low   GOALS: Goals reviewed with patient? Yes  SHORT TERM GOALS: Target date: 06/27/23 I with her previous HEP Baseline: Goal status: ONGOING 06/26/23  LONG TERM GOALS: Target date: 08/22/23  I with final, updated HEP Baseline:  Goal status: INITIAL  2.  Improve FOTO to at least 63 Baseline:  Goal status: INITIAL  3.  Patient will be able to return to her normal walking routine with R hip pain < 3/10 Baseline:  Goal status: INITIAL  4.  Patient will be able to climb steps, up/down using a single rail, step over step, with R hip pain <3/10 Baseline:  Goal status: INITIAL  5.  Patient will report pain in her R hip remains <3/10 throughout the day. Baseline:  Goal status: INITIAL  PLAN:  PT FREQUENCY: 2x/week  PT DURATION: 10 weeks  PLANNED INTERVENTIONS: 97110-Therapeutic exercises, 97530- Therapeutic activity, [id number]- Neuromuscular re-education, 97535- Self Care, [id number]- Manual therapy, [id number]- Gait training, 97014- Electrical stimulation (unattended), [phone]- Ionotophoresis 4mg /ml Dexamethasone, Patient/Family education, Balance training, Stair training, Dry Needling, Joint mobilization, Spinal mobilization, Cryotherapy, and Moist heat  PLAN FOR NEXT SESSION: Re-assess and update HEP, acute pain treatment for R hip. Has she been using heat/massage gun at home? Try not to overlap exercises/interventions she is already doing at home, try dry needling next visit?   Nedra Hai, PT, DPT 06/26/23 10:58 AM

## 2023-06-26 NOTE — Progress Notes (Unsigned)
[facility] [address] [place] [address] Phone: [phone] Subjective:   Taylor Anderson, am serving as a scribe for Dr. Antoine Anderson.  I'm seeing this patient by the request  of:  Taylor Reichmann, DO  CC: Right hip pain  SAY:[id number]  03/28/2023 Patient is doing much better overall.  Has had 1 injection previously.  He is already feeling 85% better.  Did do a lot of traveling that did take patient outside some of the routine patient was making impression with initially.  Discussed which activities would be more beneficial otherwise.  Follow-up with me again in 6 to 8 weeks Patient did do very well though with physical therapy and dry needling.  Will put in an urgent referral in case patient would like to do another evaluation.      Update 06/27/2023 Taylor Anderson is a 72 y.o. female coming in with complaint of R hip pain. Patient has been doing PT. Patient states walking is fine, but steps is still a problem. Started PT again about 3 weeks ago.      Past Medical History:  Diagnosis Date   Back pain    Enlarged thyroid 2012   2 nodules, biopsy showing goiter   Migraines    between 1980-1983   Osteopenia    Pleurisy 1986   Pneumonia    hx of   Shingles    Past Surgical History:  Procedure Laterality Date   COLONOSCOPY W/ POLYPECTOMY  2002   DILATION AND CURETTAGE OF UTERUS  1993   TONSILLECTOMY AND ADENOIDECTOMY  1959   TUBAL LIGATION  1993   Social History   Socioeconomic History   Marital status: Married    Spouse name: Not on file   Number of children: Not on file   Years of education: Not on file   Highest education level: Not on file  Occupational History   Occupation: Former Runner, broadcasting/film/video  Tobacco Use   Smoking status: Never   Smokeless tobacco: Never  Vaping Use   Vaping status: Never Used  Substance and Sexual Activity   Alcohol use: No   Drug use: No   Sexual activity: Yes    Partners: Male    Birth  control/protection: Surgical    Comment: btl  Other Topics Concern   Not on file  Social History Narrative   Not on file   Social Determinants of Health   Financial Resource Strain: Not on file  Food Insecurity: Not on file  Transportation Needs: Not on file  Physical Activity: Not on file  Stress: Not on file  Social Connections: Not on file   Allergies  Allergen Reactions   Gabapentin     Chest tightness, sleepiness   Sulfa Antibiotics     rash   Family History  Problem Relation Age of Onset   Asthma Maternal Aunt    Heart disease Maternal Grandfather    Cancer Paternal Grandfather        colon   Diabetes Maternal Grandmother    Heart disease Father        bypass   Breast cancer Neg Hx       Current Outpatient Medications (Respiratory):    fexofenadine (ALLEGRA) 180 MG tablet, Take 180 mg by mouth daily.  Current Outpatient Medications (Analgesics):    meloxicam (MOBIC) 7.5 MG tablet, Take 1 tablet (7.5 mg total) by mouth daily.   Current Outpatient Medications (Other):    Calcium Citrate-Vitamin D (CALCIUM + D  PO), 1 tablet with food   Cholecalciferol (VITAMIN D PO), Take 4,000 Int'l Units by mouth daily.    Multiple Vitamins-Minerals (MULTIVITAMIN PO), Take by mouth daily.   TURMERIC CURCUMIN PO, Take 500 mg by mouth daily.     Objective  Blood pressure 114/70, pulse (!) 107, height 5\' 7"  (1.702 m), weight 160 lb (72.6 kg), last menstrual period 11/17/2005, SpO2 96%.   General: No apparent distress alert and oriented x3 mood and affect normal, dressed appropriately.  HEENT: Pupils equal, extraocular movements intact  Respiratory: Patient's speak in full sentences and does not appear short of breath  Cardiovascular: No lower extremity edema, non tender, no erythema  Right hip exam still has some mild tenderness over the greater trochanteric area but sitting relatively comfortably today.    Impression and Recommendations:     The above documentation  has been reviewed and is accurate and complete Taylor Saa, DO

## 2023-06-27 ENCOUNTER — Ambulatory Visit: Payer: Medicare HMO | Admitting: Family Medicine

## 2023-06-27 ENCOUNTER — Encounter: Payer: Self-pay | Admitting: Family Medicine

## 2023-06-27 VITALS — BP 114/70 | HR 107 | Ht 67.0 in | Wt 160.0 lb

## 2023-06-27 DIAGNOSIS — M25551 Pain in right hip: Secondary | ICD-10-CM

## 2023-06-27 MED ORDER — MELOXICAM 7.5 MG PO TABS: 7.5000 mg | ORAL_TABLET | Freq: Every day | ORAL | 0 refills | Status: DC

## 2023-06-27 NOTE — Assessment & Plan Note (Signed)
Patient continues to have some difficulty.  Does have the greater trochanteric bursitis as well.  Discussed with patient that we could consider the possibility of injection.  Wants to hold at this point.  Given meloxicam 7.5 mg to take daily.  Discussed using it in a 5-day burst.  Continue to work with physical therapy.  Follow-up with me again in 6 to 8 weeks otherwise.  At that time if continuing to have difficulty we will either consider the greater trochanteric injection or consider advanced imaging if necessary.

## 2023-06-27 NOTE — Patient Instructions (Signed)
Meloxicam 7.5mg  take in 5 day burst then 2 days off Continue PT See you again in 2 months

## 2023-07-03 ENCOUNTER — Encounter: Payer: Self-pay | Admitting: Physical Therapy

## 2023-07-03 ENCOUNTER — Ambulatory Visit: Payer: Medicare HMO | Admitting: Physical Therapy

## 2023-07-03 DIAGNOSIS — R262 Difficulty in walking, not elsewhere classified: Secondary | ICD-10-CM

## 2023-07-03 DIAGNOSIS — M25551 Pain in right hip: Secondary | ICD-10-CM

## 2023-07-03 DIAGNOSIS — R600 Localized edema: Secondary | ICD-10-CM

## 2023-07-03 DIAGNOSIS — M6281 Muscle weakness (generalized): Secondary | ICD-10-CM

## 2023-07-03 NOTE — Therapy (Signed)
OUTPATIENT PHYSICAL THERAPY LOWER EXTREMITY TREATMENT   Patient Name: Taylor Anderson MRN: [id number] DOB:Apr 26, 1951, 72 y.o., female Today's Date: 07/03/2023  END OF SESSION:  PT End of Session - 07/03/23 0802     Visit Number 4    Date for PT Re-Evaluation 08/22/23    PT Start Time 0758    PT Stop Time 0840    PT Time Calculation (min) 42 min    Activity Tolerance Patient tolerated treatment well    Behavior During Therapy [facility] for tasks assessed/performed               Past Medical History:  Diagnosis Date   Back pain    Enlarged thyroid 2012   2 nodules, biopsy showing goiter   Migraines    between 1980-1983   Osteopenia    Pleurisy 1986   Pneumonia    hx of   Shingles    Past Surgical History:  Procedure Laterality Date   COLONOSCOPY W/ POLYPECTOMY  2002   DILATION AND CURETTAGE OF UTERUS  1993   TONSILLECTOMY AND ADENOIDECTOMY  1959   TUBAL LIGATION  1993   Patient Active Problem List   Diagnosis Date Noted   Right hip pain 11/09/2022   Osteopenia 09/01/2022   Dermatochalasis of both upper eyelids 12/28/2017   Adie's tonic pupil, 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   Astigmatism with presbyopia, bilateral 12/25/2016   Family history of glaucoma 12/25/2016   Nuclear sclerotic cataract of both eyes 12/25/2016   Posterior vitreous detachment of both eyes 12/25/2016   Chronic cough 01/03/2011    PCP: Irena Reichmann, DO  REFERRING PROVIDER: Judi Saa, DO  REFERRING DIAG: [phone] (ICD-10-CM) - Right hip pain   THERAPY DIAG:  Pain in right hip  Difficulty in walking, not elsewhere classified  Localized edema  Muscle weakness (generalized)  Rationale for Evaluation and Treatment: Rehabilitation  ONSET DATE: 03/28/23  SUBJECTIVE:   SUBJECTIVE STATEMENT: Patient reports that she saw her Dr who prescribed Meloxicam. This has helped a lot with the pain. She has also been using heat and the massage gun.  EVAL: Patient reports that after her last PT sessions  she took a trip to [place]. Upon return her hip was very sore. She returned to the Dr. Who provided a PT referral. She tried to recover on her own, but the pain has not subsided so she wants to be sure that she is not missing anything. Climbing up or down steps is very difficult and her stiffness returns after sitting for any period of time.  PERTINENT HISTORY: Per referring physician note on 03/28/23: Patient is doing much better overall. Has had 1 injection previously. He is already feeling 85% better. Did do a lot of traveling that did take patient outside some of the routine patient was making impression with initially. Discussed which activities would be more beneficial otherwise. Follow-up with me again in 6 to 8 weeks Patient did do very well though with physical therapy and dry needling.  Will put in an urgent referral in case patient would like to do another evaluation PAIN:  Are you having pain? Yes: NPRS scale: 3/10 Pain location: R hip  Pain description: sore  Aggravating factors: stairs and getting up after sitting for a long time  Relieving factors: heat/massage gun    PRECAUTIONS: Fall  RED FLAGS: None   WEIGHT BEARING RESTRICTIONS: No  FALLS:  Has patient fallen in last 6 months? No  LIVING ENVIRONMENT: Lives with: lives with their family Lives in:  House/apartment Stairs: Yes: Internal: 14 steps; on left going up and External: 2 steps; bilateral but cannot reach both Has following equipment at home: None  OCCUPATION: Retired  PLOF: Independent  PATIENT GOALS: Patient would like to get pain relief or at least understand her limitations and whether this even can be improved.  NEXT MD VISIT: In Dec  OBJECTIVE:  Note: Objective measures were completed at Evaluation unless otherwise noted.  DIAGNOSTIC FINDINGS:  11/11/22 IMPRESSION: 1. Mild-to-moderate multilevel degenerative disc and endplate changes, worst at L4-5. 2. Grade 1 anterolisthesis of L4 on L5.  PATIENT  SURVEYS:  FOTO 55.9  COGNITION: Overall cognitive status: Within functional limits for tasks assessed     SENSATION: WFL  EDEMA:  Chronic swelling in R knee.  MUSCLE LENGTH: Hamstrings: Right 80 deg; Left 90 deg Thomas test: Mildly impaired  POSTURE: rounded shoulders, forward head, and decreased lumbar lordosis  PALPATION: TTP R piriformis, greater trochanter, ITB  LOWER EXTREMITY ROM: WNL L. R hip ROM at least as mobile as L, except IR and add   LOWER EXTREMITY MMT: (4+)-5/5 throughout  LOWER EXTREMITY SPECIAL TESTS:  Hip special tests: Luisa Hart (FABER) test: positive , Ober's test: positive , and Hip scouring test: negative SLR test (-) B GAIT: Distance walked: In clinic distances Assistive device utilized: None Level of assistance: Complete Independence Comments: Patient reports that her pain is better today. She mostly feels it first thing in the morning, if she has been sitting a while, or when climbing steps.   TODAY'S TREATMENT:                                                                                                                              DATE:  07/03/23 NuStep L5 x 6 minutes Deep IASTM to R hip E rotators F/B stretch Bridging, bridge with march, walk outs and back, good pelvic control. Step ups- forward, side, crossover, 6" step, 10 reps each.  06/26/23 TherEx Nustep L5 x8 minutes BLEs only Figure 4 stretch 3x30 seconds R LE  R LE HS stretch 3x30 seconds supine R LE single leg bridges x10   Manual Massage gun to R hamstrings, lateral thigh, R glute and piriformis x10 minutes MHP R hip in sidelying x10 minutes prior to massage gun work,  not included in billing  06/18/23 TherEx SciFit L5 x6 minutes for w/u and tissue perfusion Lateral R thigh tight and tender, very tender along R SI joint, slight LLD with R LE 1/8/ inch longer  Self SI MET correction 6x6 seconds  Manual Percussion gun R lateral thigh and glutes x10 minutes MHP x8 minutes  R hip sidelying prior to percussion gun  06/13/23 Education  Ionto patch to R greater trochanter  PATIENT EDUCATION:  Education details: POC, review previous HEP Person educated: Patient Education method: Explanation Education comprehension: verbalized understanding  HOME EXERCISE PROGRAM: [id number] from previous therapy for hip pain and back pain, completed 02/23/23  ASSESSMENT:  CLINICAL  IMPRESSION: Patient received Meloxicam from her Dr and has had excellent relief from her pain. Spent time today discussing the progression of HEP, adding some more challenging exercises including marching and walkout bridging and step ups in all directions. Patient plans to practice new exercises at home with possible D/C at next appointment.  EVAL:Patient is a 72 y.o. who was seen today for physical therapy evaluation and treatment for recurrent R hip Pain. She reports that the pain was better after last PT encounter, but returned after a trip to [place]. She thinks it was due to the increased sitting and also the bed she slept in. She did not perform her exercises while in [place], but started back with Deep tissue mobs using tennis ball, stretching and strengthening upon return with some improvement, but she still has severe pain with trying to climb steps or after sitting. Her strength is pretty good, except possibly in SLS on R. Her ROM is mostly WFL, except R hip add and IR. The painful area is much small, directly over piriformis. She was also tender directly over her greater trochanter and along ITB. SLR test was negative. Suspect some Bursitis, which patient did confirm that her Dr had suspected the same. Provided Ionto patch and plan to review her HEP, treat the painful, tight areas, see how she responds to patch.  OBJECTIVE IMPAIRMENTS: Abnormal gait, decreased activity tolerance, decreased balance, decreased coordination, difficulty walking, decreased ROM, decreased strength, impaired flexibility, and  pain.   ACTIVITY LIMITATIONS: bending, sitting, squatting, stairs, transfers, and locomotion level  PARTICIPATION LIMITATIONS: meal prep, cleaning, laundry, driving, shopping, and community activity  PERSONAL FACTORS: Past/current experiences are also affecting patient's functional outcome.   REHAB POTENTIAL: Good  CLINICAL DECISION MAKING: Stable/uncomplicated  EVALUATION COMPLEXITY: Low   GOALS: Goals reviewed with patient? Yes  SHORT TERM GOALS: Target date: 06/27/23 I with her previous HEP Baseline: Goal status: 07/03/23-met  LONG TERM GOALS: Target date: 08/22/23  I with final, updated HEP Baseline:  Goal status: 07/03/23-updated, ongoing  2.  Improve FOTO to at least 63 Baseline:  Goal status: INITIAL  3.  Patient will be able to return to her normal walking routine with R hip pain < 3/10 Baseline:  Goal status: 07/03/23-Patient reports excellent improvement. Will make sure she is able to maintain. Ongoing  4.  Patient will be able to climb steps, up/down using a single rail, step over step, with R hip pain <3/10 Baseline:  Goal status: 07/03/23-Patient reports excellent improvement. Will make sure she is able to maintain. Ongoing  5.  Patient will report pain in her R hip remains <3/10 throughout the day. Baseline:  Goal status: 07/03/23-Patient reports excellent improvement. Will make sure she is able to maintain. Ongoing  PLAN:  PT FREQUENCY: 2x/week  PT DURATION: 10 weeks  PLANNED INTERVENTIONS: 97110-Therapeutic exercises, 97530- Therapeutic activity, [id number]- Neuromuscular re-education, 97535- Self Care, 16109- Manual therapy, [phone]- Gait training, 97014- Electrical stimulation (unattended), [phone]- Ionotophoresis 4mg /ml Dexamethasone, Patient/Family education, Balance training, Stair training, Dry Needling, Joint mobilization, Spinal mobilization, Cryotherapy, and Moist heat  PLAN FOR NEXT SESSION: Re-assess and update HEP, acute pain treatment for R hip.  07/03/23 Possible D/C  Oley Balm DPT 07/03/23 8:52 AM

## 2023-07-10 ENCOUNTER — Encounter: Payer: Self-pay | Admitting: Physical Therapy

## 2023-07-10 ENCOUNTER — Ambulatory Visit: Payer: Medicare HMO | Admitting: Physical Therapy

## 2023-07-10 DIAGNOSIS — M25551 Pain in right hip: Secondary | ICD-10-CM

## 2023-07-10 DIAGNOSIS — R600 Localized edema: Secondary | ICD-10-CM

## 2023-07-10 DIAGNOSIS — R262 Difficulty in walking, not elsewhere classified: Secondary | ICD-10-CM

## 2023-07-10 DIAGNOSIS — M6281 Muscle weakness (generalized): Secondary | ICD-10-CM

## 2023-07-10 NOTE — Therapy (Signed)
OUTPATIENT PHYSICAL THERAPY LOWER EXTREMITY TREATMENT  PHYSICAL THERAPY DISCHARGE SUMMARY  Visits from Start of Care: 5  Current functional level related to goals / functional outcomes: Goals met   Remaining deficits: N/A   Education / Equipment: HEP   Patient agrees to discharge. Patient goals were met. Patient is being discharged due to meeting the stated rehab goals.   Patient Name: Taylor Anderson MRN: [id number] DOB:02/26/1951, 72 y.o., female Today's Date: 07/10/2023  END OF SESSION:  PT End of Session - 07/10/23 0803     Visit Number 5    Date for PT Re-Evaluation 08/22/23    PT Start Time 0800    PT Stop Time 0840    PT Time Calculation (min) 40 min    Activity Tolerance Patient tolerated treatment well    Behavior During Therapy [facility] for tasks assessed/performed            Past Medical History:  Diagnosis Date   Back pain    Enlarged thyroid 2012   2 nodules, biopsy showing goiter   Migraines    between 1980-1983   Osteopenia    Pleurisy 1986   Pneumonia    hx of   Shingles    Past Surgical History:  Procedure Laterality Date   COLONOSCOPY W/ POLYPECTOMY  2002   DILATION AND CURETTAGE OF UTERUS  1993   TONSILLECTOMY AND ADENOIDECTOMY  1959   TUBAL LIGATION  1993   Patient Active Problem List   Diagnosis Date Noted   Right hip pain 11/09/2022   Osteopenia 09/01/2022   Dermatochalasis of both upper eyelids 12/28/2017   Adie's tonic pupil, 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   Astigmatism with presbyopia, bilateral 12/25/2016   Family history of glaucoma 12/25/2016   Nuclear sclerotic cataract of both eyes 12/25/2016   Posterior vitreous detachment of both eyes 12/25/2016   Chronic cough 01/03/2011    PCP: Irena Reichmann, DO  REFERRING PROVIDER: Judi Saa, DO  REFERRING DIAG: [phone] (ICD-10-CM) - Right hip pain   THERAPY DIAG:  Pain in right hip  Difficulty in walking, not elsewhere classified  Localized edema  Muscle weakness  (generalized)  Rationale for Evaluation and Treatment: Rehabilitation  ONSET DATE: 03/28/23  SUBJECTIVE:   SUBJECTIVE STATEMENT: Patient arrives reporting her "semi annual" back pain, which happens 2-3 times/year. Unexplained cause, but it lasts 2-4 days and then resolves.  EVAL: Patient reports that after her last PT sessions she took a trip to [place]. Upon return her hip was very sore. She returned to the Dr. Who provided a PT referral. She tried to recover on her own, but the pain has not subsided so she wants to be sure that she is not missing anything. Climbing up or down steps is very difficult and her stiffness returns after sitting for any period of time.  PERTINENT HISTORY: Per referring physician note on 03/28/23: Patient is doing much better overall. Has had 1 injection previously. He is already feeling 85% better. Did do a lot of traveling that did take patient outside some of the routine patient was making impression with initially. Discussed which activities would be more beneficial otherwise. Follow-up with me again in 6 to 8 weeks Patient did do very well though with physical therapy and dry needling.  Will put in an urgent referral in case patient would like to do another evaluation PAIN:  Are you having pain? Yes: NPRS scale: 3/10 Pain location: R hip  Pain description: sore  Aggravating factors: stairs and getting up after  sitting for a long time  Relieving factors: heat/massage gun    PRECAUTIONS: Fall  RED FLAGS: None   WEIGHT BEARING RESTRICTIONS: No  FALLS:  Has patient fallen in last 6 months? No  LIVING ENVIRONMENT: Lives with: lives with their family Lives in: House/apartment Stairs: Yes: Internal: 14 steps; on left going up and External: 2 steps; bilateral but cannot reach both Has following equipment at home: None  OCCUPATION: Retired  PLOF: Independent  PATIENT GOALS: Patient would like to get pain relief or at least understand her limitations and  whether this even can be improved.  NEXT MD VISIT: In Dec  OBJECTIVE:  Note: Objective measures were completed at Evaluation unless otherwise noted.  DIAGNOSTIC FINDINGS:  11/11/22 IMPRESSION: 1. Mild-to-moderate multilevel degenerative disc and endplate changes, worst at L4-5. 2. Grade 1 anterolisthesis of L4 on L5.  PATIENT SURVEYS:  FOTO 55.9  COGNITION: Overall cognitive status: Within functional limits for tasks assessed     SENSATION: WFL  EDEMA:  Chronic swelling in R knee.  MUSCLE LENGTH: Hamstrings: Right 80 deg; Left 90 deg Thomas test: Mildly impaired  POSTURE: rounded shoulders, forward head, and decreased lumbar lordosis  PALPATION: TTP R piriformis, greater trochanter, ITB  LOWER EXTREMITY ROM: WNL L. R hip ROM at least as mobile as L, except IR and add   LOWER EXTREMITY MMT: (4+)-5/5 throughout  LOWER EXTREMITY SPECIAL TESTS:  Hip special tests: Luisa Hart (FABER) test: positive , Ober's test: positive , and Hip scouring test: negative SLR test (-) B GAIT: Distance walked: In clinic distances Assistive device utilized: None Level of assistance: Complete Independence Comments: Patient reports that her pain is better today. She mostly feels it first thing in the morning, if she has been sitting a while, or when climbing steps.   TODAY'S TREATMENT:                                                                                                                              DATE:  07/10/23 NuStep L5 x 6 minutes SI assessment with L post rotated hemipelvis Iso add/abd and standing forward flexion over L leg while elevated on mat x 5 with improved pain HEP updated Goals re-assessed.  07/03/23 NuStep L5 x 6 minutes Deep IASTM to R hip E rotators F/B stretch Bridging, bridge with march, walk outs and back, good pelvic control. Step ups- forward, side, crossover, 6" step, 10 reps each.  06/26/23 TherEx Nustep L5 x8 minutes BLEs only Figure 4 stretch  3x30 seconds R LE  R LE HS stretch 3x30 seconds supine R LE single leg bridges x10   Manual Massage gun to R hamstrings, lateral thigh, R glute and piriformis x10 minutes MHP R hip in sidelying x10 minutes prior to massage gun work,  not included in billing  06/18/23 TherEx SciFit L5 x6 minutes for w/u and tissue perfusion Lateral R thigh tight and tender, very tender along R SI joint, slight LLD with  R LE 1/8/ inch longer  Self SI MET correction 6x6 seconds  Manual Percussion gun R lateral thigh and glutes x10 minutes MHP x8 minutes R hip sidelying prior to percussion gun  06/13/23 Education  Ionto patch to R greater trochanter  PATIENT EDUCATION:  Education details: POC, review previous HEP Person educated: Patient Education method: Explanation Education comprehension: verbalized understanding  HOME EXERCISE PROGRAM: [id number] from previous therapy for hip pain and back pain, completed 02/23/23  ASSESSMENT:  CLINICAL IMPRESSION: Patient reports new onset of recurring L hip pain. Assessed and found to be likely caused by SI dysfunction. Treated with improved pain. Goals met and patient feels ready to continue treatment on her own.  EVAL:Patient is a 72 y.o. who was seen today for physical therapy evaluation and treatment for recurrent R hip Pain. She reports that the pain was better after last PT encounter, but returned after a trip to [place]. She thinks it was due to the increased sitting and also the bed she slept in. She did not perform her exercises while in [place], but started back with Deep tissue mobs using tennis ball, stretching and strengthening upon return with some improvement, but she still has severe pain with trying to climb steps or after sitting. Her strength is pretty good, except possibly in SLS on R. Her ROM is mostly WFL, except R hip add and IR. The painful area is much small, directly over piriformis. She was also tender directly over her greater trochanter and along  ITB. SLR test was negative. Suspect some Bursitis, which patient did confirm that her Dr had suspected the same. Provided Ionto patch and plan to review her HEP, treat the painful, tight areas, see how she responds to patch.  OBJECTIVE IMPAIRMENTS: Abnormal gait, decreased activity tolerance, decreased balance, decreased coordination, difficulty walking, decreased ROM, decreased strength, impaired flexibility, and pain.   ACTIVITY LIMITATIONS: bending, sitting, squatting, stairs, transfers, and locomotion level  PARTICIPATION LIMITATIONS: meal prep, cleaning, laundry, driving, shopping, and community activity  PERSONAL FACTORS: Past/current experiences are also affecting patient's functional outcome.   REHAB POTENTIAL: Good  CLINICAL DECISION MAKING: Stable/uncomplicated  EVALUATION COMPLEXITY: Low   GOALS: Goals reviewed with patient? Yes  SHORT TERM GOALS: Target date: 06/27/23 I with her previous HEP Baseline: Goal status: 07/03/23-met  LONG TERM GOALS: Target date: 08/22/23  I with final, updated HEP Baseline:  Goal status: 07/05/23-updated, met  2.  Improve FOTO to at least 63 Baseline:  Goal status: 07/10/23-70(+), met  3.  Patient will be able to return to her normal walking routine with R hip pain < 3/10 Baseline:  Goal status: 07/10/23-met  4.  Patient will be able to climb steps, up/down using a single rail, step over step, with R hip pain <3/10 Baseline:  Goal status: 07/10/23-met  5.  Patient will report pain in her R hip remains <3/10 throughout the day. Baseline:  Goal status: 07/10/23-met PLAN:  PT FREQUENCY: 2x/week  PT DURATION: 10 weeks  PLANNED INTERVENTIONS: 97110-Therapeutic exercises, 97530- Therapeutic activity, [id number]- Neuromuscular re-education, 97535- Self Care, 53664- Manual therapy, [id number]- Gait training, 97014- Electrical stimulation (unattended), [phone]- Ionotophoresis 4mg /ml Dexamethasone, Patient/Family education, Balance training, Stair  training, Dry Needling, Joint mobilization, Spinal mobilization, Cryotherapy, and Moist heat  PLAN FOR NEXT SESSION: Re-assess and update HEP, acute pain treatment for R hip. 07/03/23 Possible D/C  Oley Balm DPT 07/10/23 8:45 AM

## 2023-08-07 ENCOUNTER — Other Ambulatory Visit (HOSPITAL_BASED_OUTPATIENT_CLINIC_OR_DEPARTMENT_OTHER): Payer: Self-pay | Admitting: Obstetrics & Gynecology

## 2023-08-07 DIAGNOSIS — Z1231 Encounter for screening mammogram for malignant neoplasm of breast: Secondary | ICD-10-CM

## 2023-08-20 ENCOUNTER — Other Ambulatory Visit (HOSPITAL_BASED_OUTPATIENT_CLINIC_OR_DEPARTMENT_OTHER): Payer: Self-pay | Admitting: Family Medicine

## 2023-08-20 DIAGNOSIS — E7800 Pure hypercholesterolemia, unspecified: Secondary | ICD-10-CM

## 2023-08-23 NOTE — Progress Notes (Signed)
 [facility] [address] [place] [address] Phone: [phone] Subjective:   ISusannah Gully, am serving as a scribe for Dr. Arthea Claudene.  I'm seeing this patient by the request  of:  Gerome Brunet, DO  CC: Right hip pain follow-up  YEP:Dlagzrupcz  06/27/2023 Patient continues to have some difficulty. Does have the greater trochanteric bursitis as well. Discussed with patient that we could consider the possibility of injection. Wants to hold at this point. Given meloxicam  7.5 mg to take daily. Discussed using it in a 5-day burst. Continue to work with physical therapy. Follow-up with me again in 6 to 8 weeks otherwise. At that time if continuing to have difficulty we will either consider the greater trochanteric injection or consider advanced imaging if necessary.   Update 08/28/2023 Taylor Anderson is a 73 y.o. female coming in with complaint of R hip pain. Patient states doing much better. In depends on activity. Moving in the right direction. Meloxicam  makes a difference, only takes as needed.      Past Medical History:  Diagnosis Date   Back pain    Enlarged thyroid  2012   2 nodules, biopsy showing goiter   Migraines    between 1980-1983   Osteopenia    Pleurisy 1986   Pneumonia    hx of   Shingles    Past Surgical History:  Procedure Laterality Date   COLONOSCOPY W/ POLYPECTOMY  2002   DILATION AND CURETTAGE OF UTERUS  1993   TONSILLECTOMY AND ADENOIDECTOMY  1959   TUBAL LIGATION  1993   Social History   Socioeconomic History   Marital status: Married    Spouse name: Not on file   Number of children: Not on file   Years of education: Not on file   Highest education level: Not on file  Occupational History   Occupation: Former Runner, Broadcasting/film/video  Tobacco Use   Smoking status: Never   Smokeless tobacco: Never  Vaping Use   Vaping status: Never Used  Substance and Sexual Activity   Alcohol use: No   Drug use: No   Sexual activity: Yes     Partners: Male    Birth control/protection: Surgical    Comment: btl  Other Topics Concern   Not on file  Social History Narrative   Not on file   Social Drivers of Health   Financial Resource Strain: Not on file  Food Insecurity: Not on file  Transportation Needs: Not on file  Physical Activity: Not on file  Stress: Not on file  Social Connections: Not on file   Allergies  Allergen Reactions   Gabapentin      Chest tightness, sleepiness   Sulfa Antibiotics     rash   Family History  Problem Relation Age of Onset   Asthma Maternal Aunt    Heart disease Maternal Grandfather    Cancer Paternal Grandfather        colon   Diabetes Maternal Grandmother    Heart disease Father        bypass   Breast cancer Neg Hx       Current Outpatient Medications (Respiratory):    fexofenadine (ALLEGRA) 180 MG tablet, Take 180 mg by mouth daily.  Current Outpatient Medications (Analgesics):    meloxicam  (MOBIC ) 7.5 MG tablet, Take 1 tablet (7.5 mg total) by mouth daily.   Current Outpatient Medications (Other):    Calcium Citrate-Vitamin D (CALCIUM + D PO), 1 tablet with food   Cholecalciferol (  VITAMIN D PO), Take 4,000 Int'l Units by mouth daily.    Multiple Vitamins-Minerals (MULTIVITAMIN PO), Take by mouth daily.   TURMERIC CURCUMIN PO, Take 500 mg by mouth daily.     Objective  Blood pressure 118/76, pulse 76, height 5' 7 (1.702 m), weight 163 lb (73.9 kg), last menstrual period 11/17/2005.   General: No apparent distress alert and oriented x3 mood and affect normal, dressed appropriately.  HEENT: Pupils equal, extraocular movements intact  Respiratory: Patient's speak in full sentences and does not appear short of breath  Cardiovascular: No lower extremity edema, non tender, no erythema  Sitting comfortably at this moment, deferred any other and further evaluation.    Impression and Recommendations:    The above documentation has been reviewed and is accurate and  complete Rehaan Viloria M Ma Munoz, DO

## 2023-08-28 ENCOUNTER — Ambulatory Visit: Payer: Medicare HMO | Admitting: Family Medicine

## 2023-08-28 ENCOUNTER — Encounter: Payer: Self-pay | Admitting: Family Medicine

## 2023-08-28 VITALS — BP 118/76 | HR 76 | Ht 67.0 in | Wt 163.0 lb

## 2023-08-28 DIAGNOSIS — M25551 Pain in right hip: Secondary | ICD-10-CM

## 2023-08-28 DIAGNOSIS — M8588 Other specified disorders of bone density and structure, other site: Secondary | ICD-10-CM

## 2023-08-28 MED ORDER — MELOXICAM 7.5 MG PO TABS: 7.5000 mg | ORAL_TABLET | Freq: Every day | ORAL | 0 refills | Status: DC

## 2023-08-28 NOTE — Assessment & Plan Note (Signed)
 Patient is doing remarkably better at this time.  Discussed icing regimen and home exercises.  Patient is going to start increasing walking again but we did discuss trying to avoid consecutive days immediately.  In addition to this we did discuss the meloxicam  and refilled the 7.5 mg to take daily as needed follow-up with me again in 3 months

## 2023-08-28 NOTE — Patient Instructions (Addendum)
Meloxicam 7.5mg  refilled  Keep doing exercise Don't walk on consecutive days initially Slowly increase frequency and duration See you again in 3 months

## 2023-08-28 NOTE — Assessment & Plan Note (Signed)
We did discuss the importance of walking to continue with the bone health.  Patient will do some weight training but we discussed that not every set needs to be done with weight

## 2023-09-04 ENCOUNTER — Ambulatory Visit (HOSPITAL_BASED_OUTPATIENT_CLINIC_OR_DEPARTMENT_OTHER)
Admission: RE | Admit: 2023-09-04 | Discharge: 2023-09-04 | Disposition: A | Discharge status: Home / Self Care | Payer: Self-pay | Source: Ambulatory Visit | Attending: Family Medicine | Admitting: Family Medicine

## 2023-09-04 DIAGNOSIS — E7800 Pure hypercholesterolemia, unspecified: Secondary | ICD-10-CM | POA: Insufficient documentation

## 2023-09-11 ENCOUNTER — Encounter (HOSPITAL_BASED_OUTPATIENT_CLINIC_OR_DEPARTMENT_OTHER): Payer: Self-pay | Admitting: Radiology

## 2023-09-11 ENCOUNTER — Ambulatory Visit (HOSPITAL_BASED_OUTPATIENT_CLINIC_OR_DEPARTMENT_OTHER)
Admission: RE | Admit: 2023-09-11 | Discharge: 2023-09-11 | Disposition: A | Discharge status: Home / Self Care | Payer: Medicare HMO | Source: Ambulatory Visit | Attending: Obstetrics & Gynecology | Admitting: Obstetrics & Gynecology

## 2023-09-11 DIAGNOSIS — Z1231 Encounter for screening mammogram for malignant neoplasm of breast: Secondary | ICD-10-CM | POA: Insufficient documentation

## 2023-11-26 NOTE — Progress Notes (Unsigned)
 [facility] [address] [place] [address] Phone: [phone] Subjective:   Taylor Anderson, am serving as a scribe for Dr. Ronnell Coins.  I'm seeing this patient by the request  of:  Pete Brand, DO  CC: Right hip pain  QIO:[id number]  08/28/2023 We did discuss the importance of walking to continue with the bone health.  Patient will do some weight training but we discussed that not every set needs to be done with weight     Patient is doing remarkably better at this time. Discussed icing regimen and home exercises. Patient is going to start increasing walking again but we did discuss trying to avoid consecutive days immediately. In addition to this we did discuss the meloxicam  and refilled the 7.5 mg to take daily as needed follow-up with me again in 3 months   Update 11/27/2023 Taylor Anderson is a 73 y.o. female coming in with complaint of R hip pain. Patient states that her hip is doing great. No pain with any activities.        Past Medical History:  Diagnosis Date   Back pain    Enlarged thyroid  2012   2 nodules, biopsy showing goiter   Migraines    between 1980-1983   Osteopenia    Pleurisy 1986   Pneumonia    hx of   Shingles    Past Surgical History:  Procedure Laterality Date   COLONOSCOPY W/ POLYPECTOMY  2002   DILATION AND CURETTAGE OF UTERUS  1993   TONSILLECTOMY AND ADENOIDECTOMY  1959   TUBAL LIGATION  1993   Social History   Socioeconomic History   Marital status: Married    Spouse name: Not on file   Number of children: Not on file   Years of education: Not on file   Highest education level: Not on file  Occupational History   Occupation: Former Runner, broadcasting/film/video  Tobacco Use   Smoking status: Never   Smokeless tobacco: Never  Vaping Use   Vaping status: Never Used  Substance and Sexual Activity   Alcohol use: No   Drug use: No   Sexual activity: Yes    Partners: Male    Birth control/protection: Surgical     Comment: btl  Other Topics Concern   Not on file  Social History Narrative   Not on file   Social Drivers of Health   Financial Resource Strain: Not on file  Food Insecurity: Not on file  Transportation Needs: Not on file  Physical Activity: Not on file  Stress: Not on file  Social Connections: Not on file   Allergies  Allergen Reactions   Gabapentin      Chest tightness, sleepiness   Sulfa Antibiotics     rash   Family History  Problem Relation Age of Onset   Asthma Maternal Aunt    Heart disease Maternal Grandfather    Cancer Paternal Grandfather        colon   Diabetes Maternal Grandmother    Heart disease Father        bypass   Breast cancer Neg Hx       Current Outpatient Medications (Respiratory):    fexofenadine (ALLEGRA) 180 MG tablet, Take 180 mg by mouth daily.  Current Outpatient Medications (Analgesics):    meloxicam  (MOBIC ) 7.5 MG tablet, Take 1 tablet (7.5 mg total) by mouth daily.   Current Outpatient Medications (Other):    Calcium Citrate-Vitamin D (CALCIUM + D PO), 1 tablet with  food   Cholecalciferol (VITAMIN D PO), Take 4,000 Int'l Units by mouth daily.    Multiple Vitamins-Minerals (MULTIVITAMIN PO), Take by mouth daily.   TURMERIC CURCUMIN PO, Take 500 mg by mouth daily.     Objective  Blood pressure 108/74, pulse 83, height 5\' 7"  (1.702 m), weight 162 lb (73.5 kg), last menstrual period 11/17/2005, SpO2 97%.   General: No apparent distress alert and oriented x3 mood and affect normal, dressed appropriately.  HEENT: Pupils equal, extraocular movements intact  Respiratory: Patient's speak in full sentences and does not appear short of breath  Cardiovascular: No lower extremity edema, non tender, no erythema   Right hip on exam today has relatively improved range of motion.  Still has some tightness noted.  Negative straight leg test.  Patient is able to ambulate without any difficulty.   Impression and Recommendations:    The above  documentation has been reviewed and is accurate and complete Yarielis Funaro M Yailine Ballard, DO

## 2023-11-27 ENCOUNTER — Other Ambulatory Visit: Payer: Self-pay

## 2023-11-27 ENCOUNTER — Ambulatory Visit: Payer: Medicare HMO | Admitting: Family Medicine

## 2023-11-27 VITALS — BP 108/74 | HR 83 | Ht 67.0 in | Wt 162.0 lb

## 2023-11-27 DIAGNOSIS — M25551 Pain in right hip: Secondary | ICD-10-CM | POA: Diagnosis not present

## 2023-11-27 NOTE — Patient Instructions (Signed)
 CoQ10 200mg  daily See us  when needed

## 2023-11-27 NOTE — Assessment & Plan Note (Signed)
 Significant improvement noted.  No pain on exam today and able to sleep comfortably.  Patient has had full control over sleep as well as being active.  Follow-up with me as needed

## 2024-03-31 ENCOUNTER — Other Ambulatory Visit: Payer: Self-pay | Admitting: Family Medicine

## 2024-07-31 ENCOUNTER — Other Ambulatory Visit (HOSPITAL_BASED_OUTPATIENT_CLINIC_OR_DEPARTMENT_OTHER): Payer: Self-pay | Admitting: Family Medicine

## 2024-07-31 DIAGNOSIS — Z1231 Encounter for screening mammogram for malignant neoplasm of breast: Secondary | ICD-10-CM

## 2024-08-18 ENCOUNTER — Institutional Professional Consult (permissible substitution) (INDEPENDENT_AMBULATORY_CARE_PROVIDER_SITE_OTHER): Admitting: Otolaryngology

## 2024-09-02 ENCOUNTER — Ambulatory Visit (HOSPITAL_BASED_OUTPATIENT_CLINIC_OR_DEPARTMENT_OTHER): Admitting: Obstetrics & Gynecology

## 2024-09-04 ENCOUNTER — Ambulatory Visit (HOSPITAL_BASED_OUTPATIENT_CLINIC_OR_DEPARTMENT_OTHER): Payer: Medicare HMO | Admitting: Obstetrics & Gynecology

## 2024-09-16 ENCOUNTER — Institutional Professional Consult (permissible substitution) (INDEPENDENT_AMBULATORY_CARE_PROVIDER_SITE_OTHER): Admitting: Otolaryngology

## 2024-09-16 ENCOUNTER — Ambulatory Visit (HOSPITAL_BASED_OUTPATIENT_CLINIC_OR_DEPARTMENT_OTHER): Payer: Self-pay | Admitting: Obstetrics & Gynecology

## 2024-09-19 ENCOUNTER — Ambulatory Visit (HOSPITAL_BASED_OUTPATIENT_CLINIC_OR_DEPARTMENT_OTHER): Admitting: Radiology
